# Patient Record
Sex: Male | Born: 1960 | Race: White | Hispanic: No | Marital: Single | Smoking: Never smoker
Health system: Southern US, Community
[De-identification: ages and names within clinical notes are randomized; demographics above are authoritative.]

## PROBLEM LIST (undated history)

## (undated) DIAGNOSIS — N400 Enlarged prostate without lower urinary tract symptoms: Secondary | ICD-10-CM

## (undated) DIAGNOSIS — F419 Anxiety disorder, unspecified: Secondary | ICD-10-CM

## (undated) DIAGNOSIS — R51 Headache: Secondary | ICD-10-CM

## (undated) DIAGNOSIS — C801 Malignant (primary) neoplasm, unspecified: Secondary | ICD-10-CM

## (undated) DIAGNOSIS — R519 Headache, unspecified: Secondary | ICD-10-CM

## (undated) DIAGNOSIS — R972 Elevated prostate specific antigen [PSA]: Secondary | ICD-10-CM

## (undated) DIAGNOSIS — K219 Gastro-esophageal reflux disease without esophagitis: Secondary | ICD-10-CM

## (undated) HISTORY — DX: Benign prostatic hyperplasia without lower urinary tract symptoms: N40.0

## (undated) HISTORY — DX: Elevated prostate specific antigen (PSA): R97.20

## (undated) HISTORY — DX: Gastro-esophageal reflux disease without esophagitis: K21.9

---

## 1988-07-20 HISTORY — PX: MOLE REMOVAL: SHX2046

## 2013-05-08 ENCOUNTER — Ambulatory Visit: Payer: Self-pay | Admitting: Unknown Physician Specialty

## 2014-09-27 IMAGING — CR PARANASAL SINUSES - COMPLETE 3 + VIEW
1 series · 5 of 5 positions shown · non-contrast
Comparison: none

REASON FOR EXAM: headache
COMMENTS:

[Series 1: (person_name) pa · 0.14mm/px · 5 of 5 slices shown]
[im 1/5]
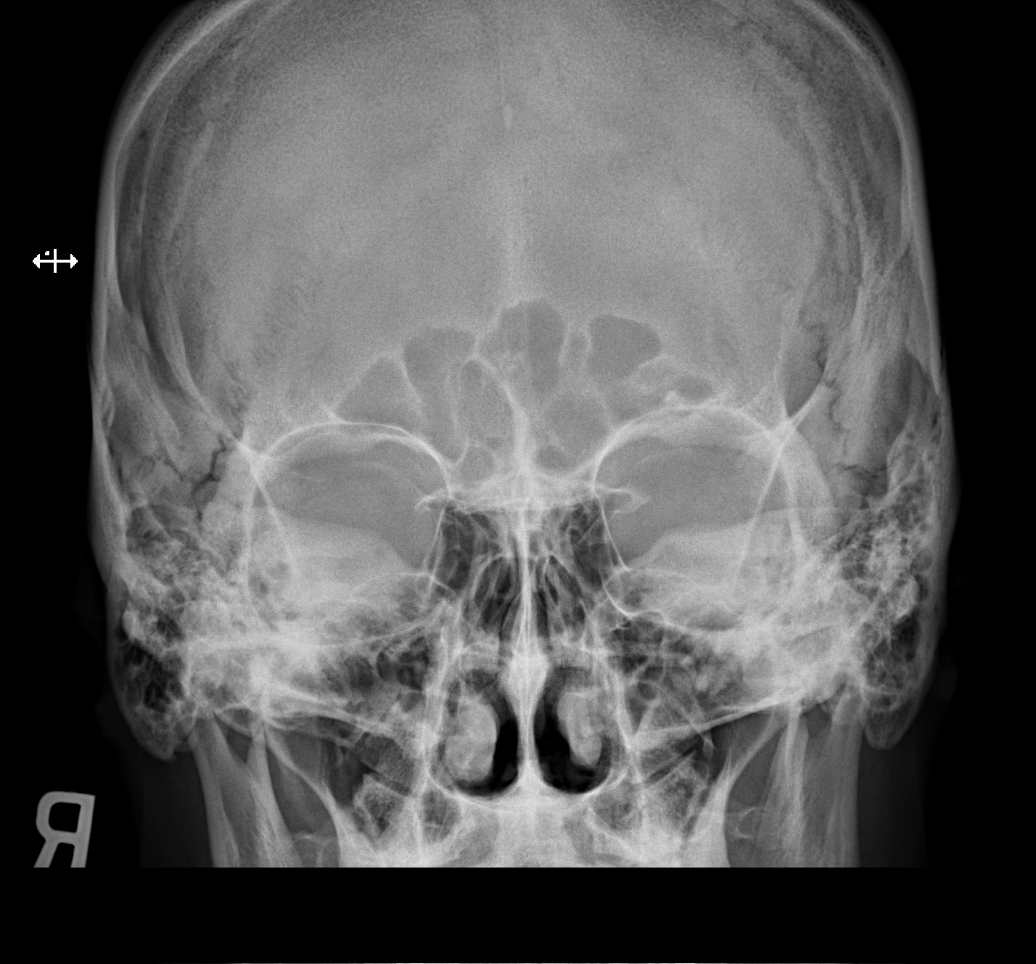
[im 2/5]
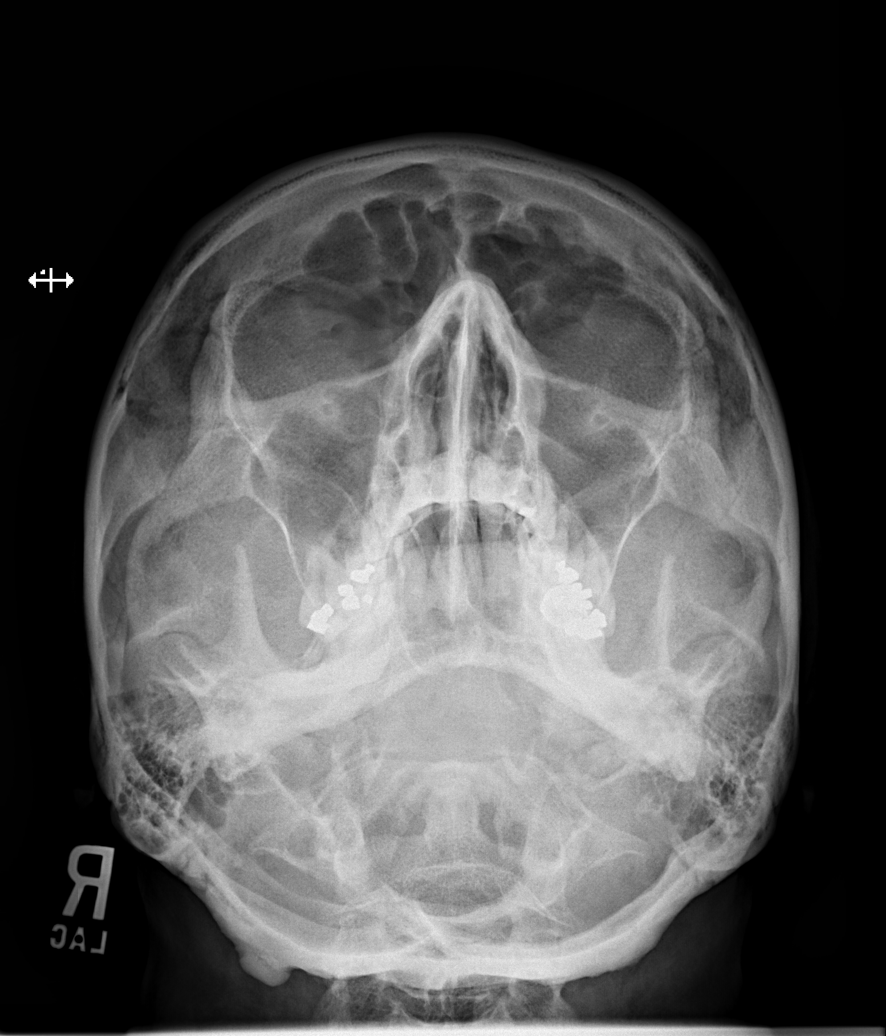
[im 3/5]
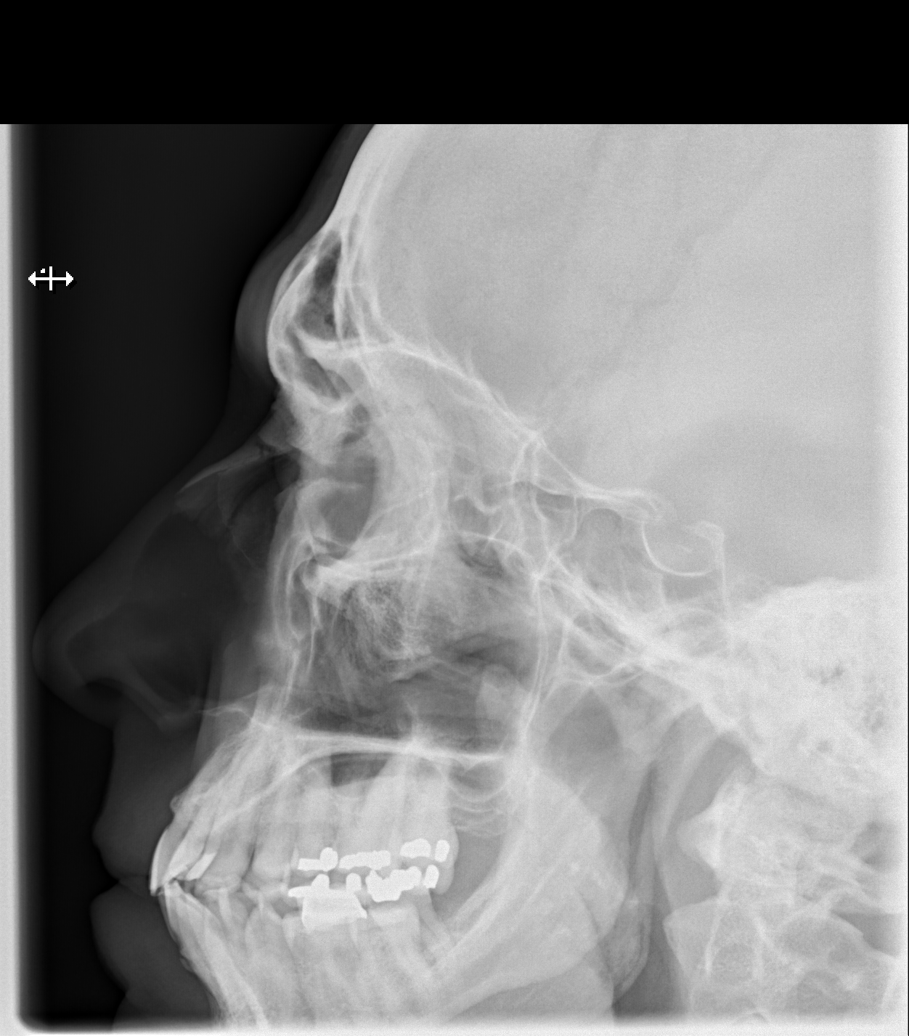
[im 4/5]
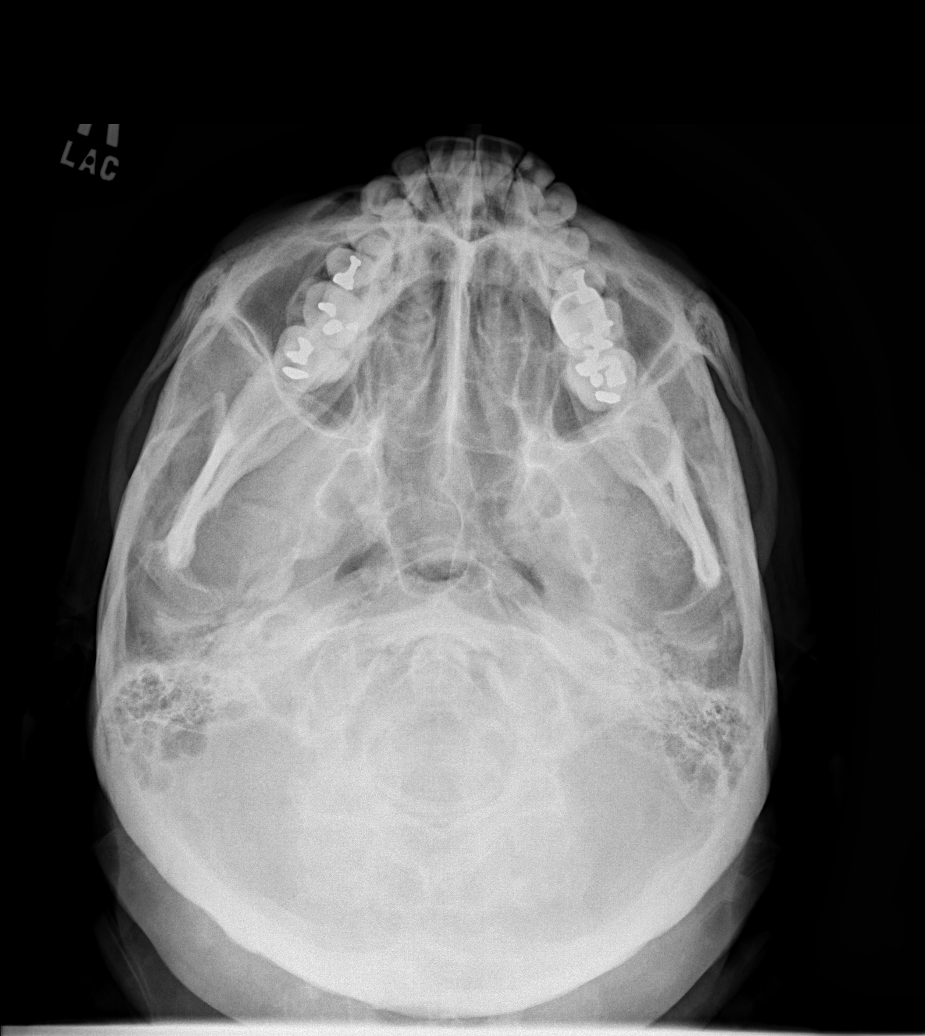
[im 5/5]
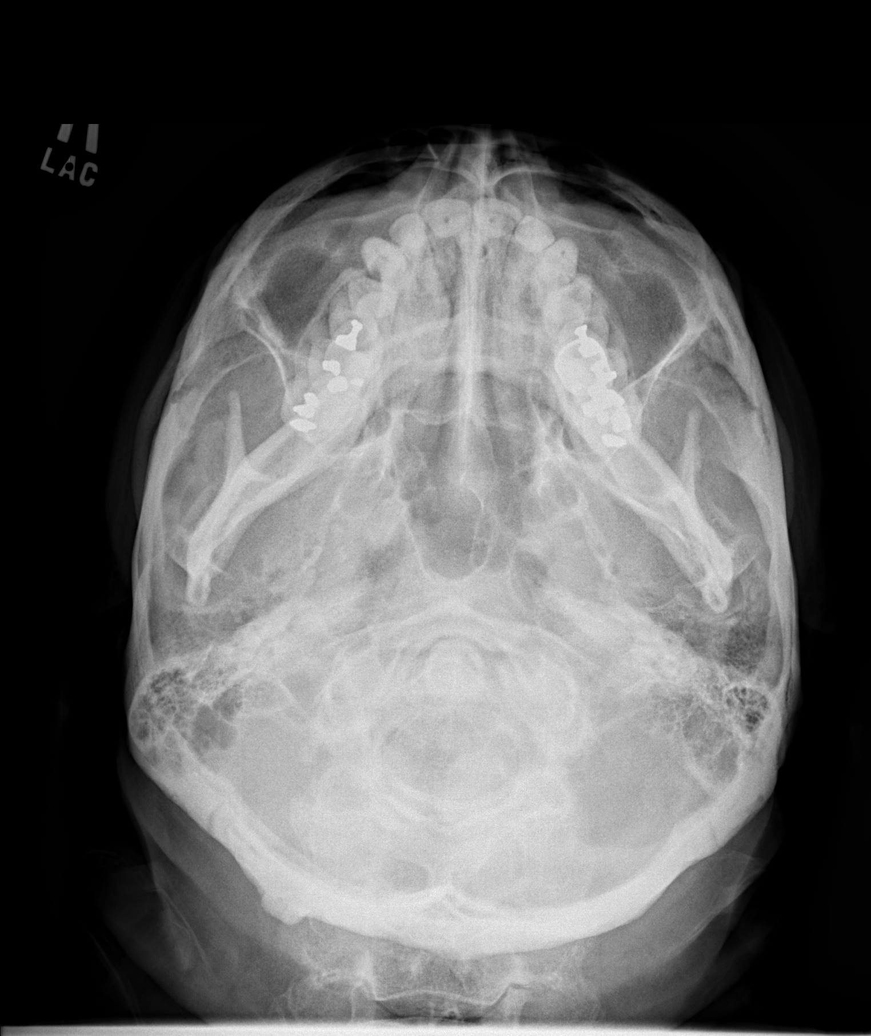

[5 of 5 positions shown; findings below may reference images not displayed]

PROCEDURE:     DXR - DXR SINUSES PARANASAL COMPLETE  - May 08, 2013  [DATE]

RESULT:     Five views of the paranasal sinuses are reviewed. The frontal
sinuses are well pneumatized and clear. The ethmoid and maxillary and
sphenoid sinuses also appear clear. The nasal septum is midline. There is no
evidence of nasal bone fracture. The bony orbits are grossly normal.
IMPRESSION: There is no evidence of acute or chronic sinus
inflammation.

[REDACTED]

## 2015-01-09 ENCOUNTER — Encounter: Payer: Self-pay | Admitting: Urology

## 2015-01-09 ENCOUNTER — Ambulatory Visit (INDEPENDENT_AMBULATORY_CARE_PROVIDER_SITE_OTHER): Payer: BC Managed Care – PPO | Admitting: Urology

## 2015-01-09 VITALS — BP 138/83 | HR 74 | Ht 71.0 in | Wt 196.0 lb

## 2015-01-09 DIAGNOSIS — R972 Elevated prostate specific antigen [PSA]: Secondary | ICD-10-CM

## 2015-01-09 DIAGNOSIS — N401 Enlarged prostate with lower urinary tract symptoms: Secondary | ICD-10-CM | POA: Diagnosis not present

## 2015-01-09 DIAGNOSIS — N138 Other obstructive and reflux uropathy: Secondary | ICD-10-CM

## 2015-01-09 NOTE — Progress Notes (Signed)
01/09/2015 10:18 AM   Isaiah Bush [birth year] [id number]  Referring provider: No referring provider defined for this encounter.  Chief Complaint  Patient presents with  . Elevated PSA    12month w/PSA    HPI: Mr. Brindisi is a 54 year old white male with an elevated PSA who presents today for a recheck on his PSA.  He was referred to [place] in January 2016 by Dr. Elisabeth Pigeon when his PSA returned at 9.4 ng/mL.  We repeated the PSA and it returned at 7.5 ng/mL.  He did some independent research and wanted to try 30 days of an antibiotic and repeat the PSA.  PSA returned at 7.4 ng/mL.    He was against having a biopsy at first, so he compromised on a follow up PSA in 4 months.  During the four months, he decided he wanted a biopsy.  I asked him to repeat the PSA one more time before pursuing a biopsy. He does have an enlarged prostate on exam, but no nodules were palpated.    He has no FM Hx of PCa.   PSA History:    9.4 ng/mL on 07/04/2014    7.5 ng/mL on 08/03/2014    7.4 ng/mL on 09/04/2014  PNH: Past Medical History  Diagnosis Date  . Acid reflux   . BPH (benign prostatic hyperplasia)   . Elevated PSA     Surgical History: Past Surgical History  Procedure Laterality Date  . Mole removal  1990    Home Medications:    Medication List       This list is accurate as of: 01/09/15 10:18 AM.  Always use your most recent med list.               fluticasone 50 MCG/ACT nasal spray  Commonly known as:  FLONASE  Place into both nostrils daily.        Allergies: No Known Allergies  Family History: Family History  Problem Relation Age of Onset  . Hypertension Mother   . Cancer Paternal Grandmother     brain cancer   . Cancer Maternal Grandmother     ovarian cancer     Social History:  reports that he has never smoked. He does not have any smokeless tobacco history on file. He reports that he does not drink alcohol or use illicit drugs.  ROS: Urological  Symptom Review  Patient is experiencing the following symptoms: Hard to postpone urination Get up at night to urinate   Review of Systems  Gastrointestinal (upper)  : Negative for upper GI symptoms  Gastrointestinal (lower) : Negative for lower GI symptoms  Constitutional : Negative for symptoms  Skin: Negative for skin symptoms  Eyes: Negative for eye symptoms  Ear/Nose/Throat : Negative for Ear/Nose/Throat symptoms  Hematologic/Lymphatic: Negative for Hematologic/Lymphatic symptoms  Cardiovascular : Negative for cardiovascular symptoms  Respiratory : Negative for respiratory symptoms  Endocrine: Negative for endocrine symptoms  Musculoskeletal: Negative for musculoskeletal symptoms  Neurological: Negative for neurological symptoms  Psychologic: Negative for psychiatric symptoms   Physical Exam: BP 138/83 mmHg  Pulse 74  Ht 5\' 11"  (1.803 m)  Wt 196 lb (88.905 kg)  BMI 27.35 kg/m2     Laboratory Data: Results for orders placed or performed in visit on 01/09/15  PSA  Result Value Ref Range   Prostate Specific Ag, Serum 7.3 (H) 0.0 - 4.0 ng/mL   No results found for: WBC, HGB, HCT, MCV, PLT  No results found for: CREATININE  No results found for: PSA  No results found for: TESTOSTERONE  No results found for: HGBA1C  Urinalysis No results found for: COLORURINE, APPEARANCEUR, LABSPEC, PHURINE, GLUCOSEU, HGBUR, BILIRUBINUR, KETONESUR, PROTEINUR, UROBILINOGEN, NITRITE, LEUKOCYTESUR  Pertinent Imaging:  Assessment & Plan:    1. Elevated PSA:  I discussed with the patient  the risks involved with a prostate biopsy, such as blood in urine, blood in stool, blood in semen, infection, urinary retention, and on rare occasions sepsis and death.  If his PSA returns elevated, he would like to have a 4K score prior to scheduling a biopsy.   - PSA is drawn today, returned at 7.3 ng/mL  PSA history:    9.4 ng/mL on 07/04/2014    7.5 ng/mL on  07/24/2014    7.4 ng/mL on 09/04/2014    7.3 ng/mL on 01/09/2015  2. BPH (benign prostatic hyperplasia) with LUTS:  I-PSS score is 1 (mild); Quality of life 1 (pleased) on 09/04/2014.     No Follow-up on file.  Zara Council, [facility] [address], [place], [place] [address] [phone]

## 2015-01-10 ENCOUNTER — Telehealth: Payer: Self-pay

## 2015-01-10 DIAGNOSIS — N138 Other obstructive and reflux uropathy: Secondary | ICD-10-CM | POA: Insufficient documentation

## 2015-01-10 DIAGNOSIS — R972 Elevated prostate specific antigen [PSA]: Secondary | ICD-10-CM | POA: Insufficient documentation

## 2015-01-10 DIAGNOSIS — N401 Enlarged prostate with lower urinary tract symptoms: Secondary | ICD-10-CM

## 2015-01-10 LAB — PSA: PROSTATE SPECIFIC AG, SERUM: 7.3 ng/mL — AB (ref 0.0–4.0)

## 2015-01-10 NOTE — Telephone Encounter (Signed)
LMOM

## 2015-01-10 NOTE — Telephone Encounter (Signed)
-----   Message from Nori Riis, PA-C sent at 01/10/2015  2:26 PM EDT ----- Patient's PSA did not change.  He would like to have a 4K score, but he wants to make sure his insurance would cover it.  Would you call the rep Annie Main) and ask him?

## 2015-01-11 NOTE — Telephone Encounter (Signed)
LMOM

## 2015-01-11 NOTE — Telephone Encounter (Signed)
-----   Message from Nori Riis, PA-C sent at 01/10/2015  2:26 PM EDT ----- Patient's PSA did not change.  He would like to have a 4K score, but he wants to make sure his insurance would cover it.  Would you call the rep Annie Main) and ask him?

## 2015-01-14 NOTE — Telephone Encounter (Signed)
LMOM- Spoke with Isaiah Bush with 4K Score.

## 2015-01-14 NOTE — Telephone Encounter (Signed)
Spoke with pt who is still interested in [place]. Made pt aware will call 4K rep and get back with him. Pt voiced understanding. Cw,lpn

## 2015-01-14 NOTE — Telephone Encounter (Signed)
-----   Message from Nori Riis, PA-C sent at 01/10/2015  2:26 PM EDT ----- Patient's PSA did not change.  He would like to have a 4K score, but he wants to make sure his insurance would cover it.  Would you call the rep Annie Main) and ask him?

## 2015-01-15 NOTE — Telephone Encounter (Signed)
LMOM- spoke with 4K Score rep in reference to insurance.

## 2015-07-21 HISTORY — PX: COLONOSCOPY: SHX174

## 2016-03-20 HISTORY — PX: PROSTATE BIOPSY: SHX241

## 2016-05-27 ENCOUNTER — Other Ambulatory Visit: Payer: Self-pay | Admitting: Urology

## 2016-07-30 ENCOUNTER — Other Ambulatory Visit (HOSPITAL_COMMUNITY): Payer: Self-pay | Admitting: *Deleted

## 2016-07-30 NOTE — Patient Instructions (Addendum)
Isaiah Bush  07/30/2016   Your procedure is scheduled on: 08-03-16  Report to [facility]  Entrance take [facility]  elevators to 3rd floor to  [place] at 915 AM.  Call this number if you have problems the morning of surgery [phone]   Remember: ONLY 1 PERSON MAY GO WITH YOU TO SHORT STAY TO GET  READY MORNING OF [place].  Do not eat food :After Midnight, Saturday NIGHT, CLEAR LIQUIDS ALL DAY Sunday [phone] PER DR BORDEN, FOLLOW ALL DR Alinda Money BOWEL PREP  INSTRUCTIONS NOTHING BY MOUTH AFTER MIDNIGHT Sunday NIGHT.      Take these medicines the morning of surgery with A SIP OF WATER: NONE              You may not have any metal on your body including hair pins and              piercings  Do not wear jewelry, make-up, lotions, powders or perfumes, deodorant             Do not wear nail polish.  Do not shave  48 hours prior to surgery.              Men may shave face and neck.   Do not bring valuables to the hospital. [place].  Contacts, dentures or bridgework may not be worn into surgery.  Leave suitcase in the car. After surgery it may be brought to your room.                  Please read over the following fact sheets you were given: _____________________________________________________________________                CLEAR LIQUID DIET   Foods Allowed                                                                     Foods Excluded  Coffee and tea, regular and decaf                             liquids that you cannot  Plain Jell-O in any flavor                                             see through such as: Fruit ices (not with fruit pulp)                                     milk, soups, orange juice  Iced Popsicles                                    All solid food Carbonated beverages, regular and diet  Cranberry, grape and apple juices Sports  drinks like Gatorade Lightly seasoned clear broth or consume(fat free) Sugar, honey syrup  Sample Menu Breakfast                                Lunch                                     Supper Cranberry juice                    Beef broth                            Chicken broth Jell-O                                     Grape juice                           Apple juice Coffee or tea                        Jell-O                                      Popsicle                                                Coffee or tea                        Coffee or tea  _____________________________________________________________________  [facility] - Preparing for Surgery Before surgery, you can play an important role.  Because skin is not sterile, your skin needs to be as free of germs as possible.  You can reduce the number of germs on your skin by washing with CHG (chlorahexidine gluconate) soap before surgery.  CHG is an antiseptic cleaner which kills germs and bonds with the skin to continue killing germs even after washing. Please DO NOT use if you have an allergy to CHG or antibacterial soaps.  If your skin becomes reddened/irritated stop using the CHG and inform your nurse when you arrive at Short Stay. Do not shave (including legs and underarms) for at least 48 hours prior to the first CHG shower.  You may shave your face/neck. Please follow these instructions carefully:  1.  Shower with CHG Soap the night before surgery and the  morning of Surgery.  2.  If you choose to wash your hair, wash your hair first as usual with your  normal  shampoo.  3.  After you shampoo, rinse your hair and body thoroughly to remove the  shampoo.                           4.  Use CHG as you would any other liquid soap.  You can apply chg directly  to the skin and wash  Gently with a scrungie or clean washcloth.  5.  Apply the CHG Soap to your body ONLY FROM THE NECK DOWN.   Do not use on face/  open                           Wound or open sores. Avoid contact with eyes, ears mouth and genitals (private parts).                       Wash face,  Genitals (private parts) with your normal soap.             6.  Wash thoroughly, paying special attention to the area where your surgery  will be performed.  7.  Thoroughly rinse your body with warm water from the neck down.  8.  DO NOT shower/wash with your normal soap after using and rinsing off  the CHG Soap.                9.  Pat yourself dry with a clean towel.            10.  Wear clean pajamas.            11.  Place clean sheets on your bed the night of your first shower and do not  sleep with pets. Day of Surgery : Do not apply any lotions/deodorants the morning of surgery.  Please wear clean clothes to the hospital/surgery center.  FAILURE TO FOLLOW THESE INSTRUCTIONS MAY RESULT IN THE CANCELLATION OF YOUR SURGERY PATIENT SIGNATURE_________________________________  NURSE SIGNATURE__________________________________  ________________________________________________________________________   Isaiah Bush  An incentive spirometer is a tool that can help keep your lungs clear and active. This tool measures how well you are filling your lungs with each breath. Taking long deep breaths may help reverse or decrease the chance of developing breathing (pulmonary) problems (especially infection) following:  A long period of time when you are unable to move or be active. BEFORE THE PROCEDURE   If the spirometer includes an indicator to show your best effort, your nurse or respiratory therapist will set it to a desired goal.  If possible, sit up straight or lean slightly forward. Try not to slouch.  Hold the incentive spirometer in an upright position. INSTRUCTIONS FOR USE  1. Sit on the edge of your bed if possible, or sit up as far as you can in bed or on a chair. 2. Hold the incentive spirometer in an upright  position. 3. Breathe out normally. 4. Place the mouthpiece in your mouth and seal your lips tightly around it. 5. Breathe in slowly and as deeply as possible, raising the piston or the ball toward the top of the column. 6. Hold your breath for 3-5 seconds or for as long as possible. Allow the piston or ball to fall to the bottom of the column. 7. Remove the mouthpiece from your mouth and breathe out normally. 8. Rest for a few seconds and repeat Steps 1 through 7 at least 10 times every 1-2 hours when you are awake. Take your time and take a few normal breaths between deep breaths. 9. The spirometer may include an indicator to show your best effort. Use the indicator as a goal to work toward during each repetition. 10. After each set of 10 deep breaths, practice coughing to be sure your lungs are clear. If you have an incision (the cut made at the time of  surgery), support your incision when coughing by placing a pillow or rolled up towels firmly against it. Once you are able to get out of bed, walk around indoors and cough well. You may stop using the incentive spirometer when instructed by your caregiver.  RISKS AND COMPLICATIONS  Take your time so you do not get dizzy or light-headed.  If you are in pain, you may need to take or ask for pain medication before doing incentive spirometry. It is harder to take a deep breath if you are having pain. AFTER USE  Rest and breathe slowly and easily.  It can be helpful to keep track of a log of your progress. Your caregiver can provide you with a simple table to help with this. If you are using the spirometer at home, follow these instructions: [place] IF:   You are having difficultly using the spirometer.  You have trouble using the spirometer as often as instructed.  Your pain medication is not giving enough relief while using the spirometer.  You develop fever of 100.5 F (38.1 C) or higher. SEEK IMMEDIATE MEDICAL CARE IF:    You cough up bloody sputum that had not been present before.  You develop fever of 102 F (38.9 C) or greater.  You develop worsening pain at or near the incision site. MAKE SURE YOU:   Understand these instructions.  Will watch your condition.  Will get help right away if you are not doing well or get worse. Document Released: 11/16/2006 Document Revised: 09/28/2011 Document Reviewed: 01/17/2007 Exit[employer] Patient Information 2014 Exit[employer], [place].   ________________________________________________________________________  WHAT IS A BLOOD TRANSFUSION? Blood Transfusion Information  A transfusion is the replacement of blood or some of its parts. Blood is made up of multiple cells which provide different functions.  Red blood cells carry oxygen and are used for blood loss replacement.  White blood cells fight against infection.  Platelets control bleeding.  Plasma helps clot blood.  Other blood products are available for specialized needs, such as hemophilia or other clotting disorders. BEFORE THE TRANSFUSION  Who gives blood for transfusions?   Healthy volunteers who are fully evaluated to make sure their blood is safe. This is blood bank blood. Transfusion therapy is the safest it has ever been in the practice of medicine. Before blood is taken from a donor, a complete history is taken to make sure that person has no history of diseases nor engages in risky social behavior (examples are intravenous drug use or sexual activity with multiple partners). The donor's travel history is screened to minimize risk of transmitting infections, such as malaria. The donated blood is tested for signs of infectious diseases, such as HIV and hepatitis. The blood is then tested to be sure it is compatible with you in order to minimize the chance of a transfusion reaction. If you or a relative donates blood, this is often done in anticipation of surgery and is not appropriate for emergency  situations. It takes many days to process the donated blood. RISKS AND COMPLICATIONS Although transfusion therapy is very safe and saves many lives, the main dangers of transfusion include:   Getting an infectious disease.  Developing a transfusion reaction. This is an allergic reaction to something in the blood you were given. Every precaution is taken to prevent this. The decision to have a blood transfusion has been considered carefully by your caregiver before blood is given. Blood is not given unless the benefits outweigh the risks. AFTER THE  TRANSFUSION  Right after receiving a blood transfusion, you will usually feel much better and more energetic. This is especially true if your red blood cells have gotten low (anemic). The transfusion raises the level of the red blood cells which carry oxygen, and this usually causes an energy increase.  The nurse administering the transfusion will monitor you carefully for complications. HOME CARE INSTRUCTIONS  No special instructions are needed after a transfusion. You may find your energy is better. Speak with your caregiver about any limitations on activity for underlying diseases you may have. SEEK MEDICAL CARE IF:   Your condition is not improving after your transfusion.  You develop redness or irritation at the intravenous (IV) site. SEEK IMMEDIATE MEDICAL CARE IF:  Any of the following symptoms occur over the next 12 hours:  Shaking chills.  You have a temperature by mouth above 102 F (38.9 C), not controlled by medicine.  Chest, back, or muscle pain.  People around you feel you are not acting correctly or are confused.  Shortness of breath or difficulty breathing.  Dizziness and fainting.  You get a rash or develop hives.  You have a decrease in urine output.  Your urine turns a dark color or changes to pink, red, or brown. Any of the following symptoms occur over the next 10 days:  You have a temperature by mouth above  102 F (38.9 C), not controlled by medicine.  Shortness of breath.  Weakness after normal activity.  The white part of the eye turns yellow (jaundice).  You have a decrease in the amount of urine or are urinating less often.  Your urine turns a dark color or changes to pink, red, or brown. Document Released: 07/03/2000 Document Revised: 09/28/2011 Document Reviewed: 02/20/2008 [facility] Patient Information 2014 [place], [place].  _______________________________________________________________________

## 2016-07-31 ENCOUNTER — Encounter (HOSPITAL_COMMUNITY)
Admission: RE | Admit: 2016-07-31 | Discharge: 2016-07-31 | Disposition: A | Discharge status: Home / Self Care | Payer: BC Managed Care – PPO | Source: Ambulatory Visit | Attending: Urology | Admitting: Urology

## 2016-07-31 ENCOUNTER — Ambulatory Visit (HOSPITAL_COMMUNITY)
Admission: RE | Admit: 2016-07-31 | Discharge: 2016-07-31 | Disposition: A | Discharge status: Home / Self Care | Payer: BC Managed Care – PPO | Source: Ambulatory Visit | Attending: Urology | Admitting: Urology

## 2016-07-31 ENCOUNTER — Encounter (HOSPITAL_COMMUNITY): Payer: Self-pay

## 2016-07-31 DIAGNOSIS — Z01810 Encounter for preprocedural cardiovascular examination: Secondary | ICD-10-CM | POA: Insufficient documentation

## 2016-07-31 DIAGNOSIS — C61 Malignant neoplasm of prostate: Secondary | ICD-10-CM | POA: Diagnosis not present

## 2016-07-31 DIAGNOSIS — Z01812 Encounter for preprocedural laboratory examination: Secondary | ICD-10-CM | POA: Insufficient documentation

## 2016-07-31 DIAGNOSIS — Z01818 Encounter for other preprocedural examination: Secondary | ICD-10-CM | POA: Insufficient documentation

## 2016-07-31 HISTORY — DX: Malignant (primary) neoplasm, unspecified: C80.1

## 2016-07-31 HISTORY — DX: Headache, unspecified: R51.9

## 2016-07-31 HISTORY — DX: Anxiety disorder, unspecified: F41.9

## 2016-07-31 HISTORY — DX: Headache: R51

## 2016-07-31 LAB — BASIC METABOLIC PANEL
Anion gap: 7 (ref 5–15)
BUN: 16 mg/dL (ref 6–20)
CHLORIDE: 102 mmol/L (ref 101–111)
CO2: 28 mmol/L (ref 22–32)
Calcium: 9.2 mg/dL (ref 8.9–10.3)
Creatinine, Ser: 0.77 mg/dL (ref 0.61–1.24)
GFR calc Af Amer: >60 mL/min (ref >60)
GFR calc non Af Amer: >60 mL/min (ref >60)
GLUCOSE: 96 mg/dL (ref 65–99)
POTASSIUM: 3.8 mmol/L (ref 3.5–5.1)
Sodium: 137 mmol/L (ref 135–145)

## 2016-07-31 LAB — CBC
HEMATOCRIT: 37.9 % — AB (ref 39.0–52.0)
Hemoglobin: 13.5 g/dL (ref 13.0–17.0)
MCH: 30.7 pg (ref 26.0–34.0)
MCHC: 35.6 g/dL (ref 30.0–36.0)
MCV: 86.1 fL (ref 78.0–100.0)
Platelets: 303 10*3/uL (ref 150–400)
RBC: 4.4 MIL/uL (ref 4.22–5.81)
RDW: 12.7 % (ref 11.5–15.5)
WBC: 5.2 10*3/uL (ref 4.0–10.5)

## 2016-07-31 LAB — ABO/RH: ABO/RH(D): B POS

## 2016-07-31 NOTE — H&P (Signed)
Office Visit Report     07/24/2016   --------------------------------------------------------------------------------   Isaiah Bush. Petitti  MRN: [id number]  PRIMARY CARE:  Dion Body, MD  DOB: 1960-08-16, 56 year old Male  REFERRING:  Dion Body, MD  SSN:   PROVIDER:  Kathie Rhodes, M.D.    TREATING:  Raynelle Bring, M.D.    LOCATION:  [facility] [phone]   --------------------------------------------------------------------------------   CC/HPI: CC: Prostate Cancer   PCP: Dr. Dion Body   Mr. Fortuno is a 56 year old gentleman who was found to have an elevated PSA of 10.6. This prompted a TRUS biopsy of the prostate on 04/09/16 that confirmed Gleason 3+4=7 adenocarcinoma of the prostate in 1 out of 12 biopsy cores. He returns today for preoperative evaluation prior to proceeding with his planned prostatectomy on 08/03/16.   Family history: None.   Imaging studies: None.   PMH: He has no medical comorbidities.  PSH: No abdominal surgeries.   TNM stage: cT1c Nx Mx  PSA: 10.6  Gleason score: 3+4=7  Biopsy (04/09/16): 1/12 cores positive  Left: L lateral mid (40%, 3+4=7)  Right: Benign  Prostate volume: 42.1 cc   Nomogram  OC disease: 50%  EPE: 48%  SVI: 2%  LNI: 2%  PFS (5 year, 10 year): 85%,75%   Urinary function: IPSS is 15. His primary symptom is nocturia 3-4 times per night. However, this is not very bothersome to him.  Erectile function: SHIM score is 23. He does not require PDE 5 inhibitors.     ALLERGIES: None   MEDICATIONS: None   GU PSH: Prostate Needle Biopsy - 04/09/2016      PSH Notes: mole removed   NON-GU PSH: Surgical Pathology, Gross And Microscopic Examination For Prostate Needle - 04/09/2016    GU PMH: Postural (urinary) incontinence - 06/10/2016 Prostate Cancer (Stable), He has elected to proceed with surgery. He had a list of questions that we addressed thoroughly and to his satisfaction. I'm going to  schedule him for appointment with Dr. Alinda Money. - 05/04/2016 BPH w/o LUTS, He has mild BPH but has no voiding symptoms. - 03/12/2016      PMH Notes: Adenocarcinoma of the prostate: He was found to have an elevated PSA of 10.6 with no abnormality noted on DRE.  TRUS/BX 04/09/16: Prostate measured 42 cc..  Pathology: Gleason 3+4 = 7 in a single core from the right lobe(40%).  Stage: T1c   NON-GU PMH: Muscle weakness (generalized) - Aug 01, 2016, - 06/10/2016 Other lack of coordination - 08-01-2016, - 06/10/2016 Other muscle spasm - 08-01-16    FAMILY HISTORY: Death of family member - Father heart failure - Father Kidney Stones - Sister No Children - Runs in Family   SOCIAL HISTORY: Marital Status: Single Current Smoking Status: Patient has never smoked.  Does not drink anymore.  Does not use drugs. Does not drink caffeine. Patient's occupation Corporate treasurer.    REVIEW OF SYSTEMS:    GU Review Male:   Patient reports get up at night to urinate and leakage of urine. Patient denies frequent urination, hard to postpone urination, burning/ pain with urination, stream starts and stops, trouble starting your streams, and have to strain to urinate .  Gastrointestinal (Lower):   Patient denies diarrhea and constipation.  Gastrointestinal (Upper):   Patient denies nausea and vomiting.  Constitutional:   Patient denies night sweats, fatigue, weight loss, and fever.  Skin:   Patient denies skin rash/ lesion and itching.  Eyes:  Patient denies blurred vision and double vision.  Ears/ Nose/ Throat:   Patient reports sinus problems. Patient denies sore throat.  Hematologic/Lymphatic:   Patient denies swollen glands and easy bruising.  Cardiovascular:   Patient denies leg swelling and chest pains.  Respiratory:   Patient denies cough and shortness of breath.  Endocrine:   Patient denies excessive thirst.  Musculoskeletal:   Patient reports back pain and joint pain.   Neurological:    Patient reports headaches. Patient denies dizziness.  Psychologic:   Patient denies depression and anxiety.   Notes: Reviewed previous review of systems 05/19/2016. No changes.    VITAL SIGNS:      07/24/2016 01:39 PM  Weight 190 lb / 86.18 kg  Height 71 in / 180.34 cm  BP 145/83 mmHg  Pulse 69 /min  BMI 26.5 kg/m   GU PHYSICAL EXAMINATION:    Prostate: Prostate about 30 grams. Left lobe normal consistency, right lobe normal consistency. Symmetrical lobes. No prostate nodule. Left lobe no tenderness, right lobe no tenderness.    MULTI-SYSTEM PHYSICAL EXAMINATION:    Constitutional: Well-nourished. No physical deformities. Normally developed. Good grooming.  Neck: Neck symmetrical, not swollen. Normal tracheal position.  Respiratory: No labored breathing, no use of accessory muscles. Clear bilaterally.  Cardiovascular: Normal temperature, normal extremity pulses, no swelling, no varicosities. Regular rate and rhythm.  Lymphatic: No enlargement of neck, axillae, groin.  Skin: No paleness, no jaundice, no cyanosis. No lesion, no ulcer, no rash.  Neurologic / Psychiatric: Oriented to time, oriented to place, oriented to person. No depression, no anxiety, no agitation.  Gastrointestinal: No mass, no tenderness, no rigidity, non obese abdomen.  Eyes: Normal conjunctivae. Normal eyelids.  Ears, Nose, Mouth, and Throat: Left ear no scars, no lesions, no masses. Right ear no scars, no lesions, no masses. Nose no scars, no lesions, no masses. Normal hearing. Normal lips.  Musculoskeletal: Normal gait and station of head and neck.     PAST DATA REVIEWED:  Source Of History:  Patient  Lab Test Review:   PSA  Records Review:   Pathology Reports, Previous Patient Records  Urine Test Review:   Urinalysis   01/28/16 01/15/16 01/09/15 09/04/14 07/24/14 07/04/14  PSA  Total PSA 10.6 ng/dl 10.31 ng/dl 7.30 ng/dl 7.4 ng/dl 7.5 ng/dl 9.4 ng/dl  Free PSA 0.91 ng/dl       % Free PSA 8.6 %          PROCEDURES:          Urinalysis w/Scope Dipstick Dipstick Cont'd Micro  Color: Yellow Bilirubin: Neg WBC/hpf: 0 - 5/hpf  Appearance: Clear Ketones: Neg RBC/hpf: 0 - 2/hpf  Specific Gravity: 1.020 Blood: Trace Bacteria: Rare (0-9/hpf)  pH: 6.0 Protein: Neg Cystals: NS (Not Seen)  Glucose: Neg Urobilinogen: 0.2 Casts: NS (Not Seen)    Nitrites: Neg Trichomonas: Not Present    Leukocyte Esterase: Neg Mucous: Not Present      Epithelial Cells: 0 - 5/hpf      Yeast: NS (Not Seen)      Sperm: Not Present    ASSESSMENT:      ICD-10 Details  1 GU:   Prostate Cancer - C61    PLAN:           Schedule Return Visit/Planned Activity: Keep Scheduled Appointment          Document Letter(s):  Created for Patient: Clinical Summary         Notes:   1. Prostate cancer: He has elected  to proceed with surgical treatment of his prostate cancer and is scheduled undergo a bilateral nerve sparing robot-assisted laparoscopic radical prostatectomy and bilateral pelvic lymphadenectomy.   We discussed surgical therapy for prostate cancer including the different available surgical approaches. We discussed, in detail, the risks and expectations of surgery with regard to cancer control, urinary control, and erectile function as well as the expected postoperative recovery process. Additional risks of surgery including but not limited to bleeding, infection, hernia formation, nerve damage, lymphocele formation, bowel/rectal injury potentially necessitating colostomy, damage to the urinary tract resulting in urine leakage, urethral stricture, and the cardiopulmonary risks such as myocardial infarction, stroke, death, venothromboembolism, etc. were explained. The risk of open surgical conversion for robotic/laparoscopic prostatectomy was also discussed.   CC: Dr. Elmer Bales      E & M CODE: I spent at least 40 minutes face to face with the patient, more than 50% of that time was spent on counseling  and/or coordinating care.     * Signed by Raynelle Bring, M.D. on 07/24/16 at 7:52 PM (EST*

## 2016-08-03 ENCOUNTER — Observation Stay (HOSPITAL_COMMUNITY)
Admission: RE | Admit: 2016-08-03 | Discharge: 2016-08-04 | Disposition: A | Discharge status: Home / Self Care | Payer: BC Managed Care – PPO | Source: Ambulatory Visit | Attending: Urology | Admitting: Urology

## 2016-08-03 ENCOUNTER — Encounter (HOSPITAL_COMMUNITY): Payer: Self-pay | Admitting: *Deleted

## 2016-08-03 ENCOUNTER — Inpatient Hospital Stay (HOSPITAL_COMMUNITY): Payer: BC Managed Care – PPO | Admitting: Registered Nurse

## 2016-08-03 ENCOUNTER — Encounter (HOSPITAL_COMMUNITY)
Admission: RE | Disposition: A | Discharge status: Home / Self Care | Payer: Self-pay | Source: Ambulatory Visit | Attending: Urology

## 2016-08-03 DIAGNOSIS — K219 Gastro-esophageal reflux disease without esophagitis: Secondary | ICD-10-CM | POA: Insufficient documentation

## 2016-08-03 DIAGNOSIS — C61 Malignant neoplasm of prostate: Secondary | ICD-10-CM | POA: Diagnosis present

## 2016-08-03 HISTORY — PX: ROBOT ASSISTED LAPAROSCOPIC RADICAL PROSTATECTOMY: SHX5141

## 2016-08-03 HISTORY — PX: LYMPHADENECTOMY: SHX5960

## 2016-08-03 LAB — TYPE AND SCREEN
ABO/RH(D): B POS
Antibody Screen: NEGATIVE

## 2016-08-03 LAB — HEMOGLOBIN AND HEMATOCRIT, BLOOD
HEMATOCRIT: 38 % — AB (ref 39.0–52.0)
HEMOGLOBIN: 13.6 g/dL (ref 13.0–17.0)

## 2016-08-03 LAB — BASIC METABOLIC PANEL
Anion gap: 10 (ref 5–15)
BUN: 9 mg/dL (ref 6–20)
CALCIUM: 7.9 mg/dL — AB (ref 8.9–10.3)
CO2: 24 mmol/L (ref 22–32)
Chloride: 102 mmol/L (ref 101–111)
Creatinine, Ser: 0.94 mg/dL (ref 0.61–1.24)
GFR calc Af Amer: >60 mL/min (ref >60)
Glucose, Bld: 135 mg/dL — ABNORMAL HIGH (ref 65–99)
POTASSIUM: 3 mmol/L — AB (ref 3.5–5.1)
SODIUM: 136 mmol/L (ref 135–145)

## 2016-08-03 SURGERY — XI ROBOTIC ASSISTED LAPAROSCOPIC RADICAL PROSTATECTOMY LEVEL 2
Anesthesia: General | Site: Abdomen

## 2016-08-03 MED ORDER — MEPERIDINE HCL 50 MG/ML IJ SOLN: 6.2500 mg | INTRAMUSCULAR | Status: DC | PRN

## 2016-08-03 MED ORDER — CEFAZOLIN SODIUM-DEXTROSE 2-4 GM/100ML-% IV SOLN
INTRAVENOUS | Status: AC
  Filled 2016-08-03: qty 100

## 2016-08-03 MED ORDER — ROCURONIUM BROMIDE 50 MG/5ML IV SOSY
PREFILLED_SYRINGE | INTRAVENOUS | Status: AC
  Filled 2016-08-03: qty 5

## 2016-08-03 MED ORDER — EPHEDRINE 5 MG/ML INJ
INTRAVENOUS | Status: AC
  Filled 2016-08-03: qty 10

## 2016-08-03 MED ORDER — LIDOCAINE 2% (20 MG/ML) 5 ML SYRINGE
INTRAMUSCULAR | Status: AC
  Filled 2016-08-03: qty 5

## 2016-08-03 MED ORDER — LIDOCAINE HCL (CARDIAC) 20 MG/ML IV SOLN
INTRAVENOUS | Status: DC | PRN
  Administered 2016-08-03: 60 mg via INTRAVENOUS

## 2016-08-03 MED ORDER — SODIUM CHLORIDE 0.9 % IR SOLN
Status: DC | PRN
  Administered 2016-08-03: 1000 mL

## 2016-08-03 MED ORDER — DOCUSATE SODIUM 100 MG PO CAPS
100.0000 mg | ORAL_CAPSULE | Freq: Two times a day (BID) | ORAL | Status: DC
  Administered 2016-08-03 – 2016-08-04 (×2): 100 mg via ORAL
  Filled 2016-08-03 (×2): qty 1

## 2016-08-03 MED ORDER — ONDANSETRON HCL 4 MG/2ML IJ SOLN
INTRAMUSCULAR | Status: DC | PRN
  Administered 2016-08-03: 4 mg via INTRAVENOUS

## 2016-08-03 MED ORDER — LACTATED RINGERS IV SOLN
INTRAVENOUS | Status: DC
  Administered 2016-08-03: 1000 mL via INTRAVENOUS
  Administered 2016-08-03 (×2): via INTRAVENOUS

## 2016-08-03 MED ORDER — PHENYLEPHRINE 40 MCG/ML (10ML) SYRINGE FOR IV PUSH (FOR BLOOD PRESSURE SUPPORT)
PREFILLED_SYRINGE | INTRAVENOUS | Status: AC
  Filled 2016-08-03: qty 10

## 2016-08-03 MED ORDER — MIDAZOLAM HCL 2 MG/2ML IJ SOLN
INTRAMUSCULAR | Status: AC
  Filled 2016-08-03: qty 2

## 2016-08-03 MED ORDER — ACETAMINOPHEN 10 MG/ML IV SOLN
INTRAVENOUS | Status: AC
  Filled 2016-08-03: qty 100

## 2016-08-03 MED ORDER — OXYCODONE HCL 5 MG/5ML PO SOLN
5.0000 mg | Freq: Once | ORAL | Status: DC | PRN
  Filled 2016-08-03: qty 5

## 2016-08-03 MED ORDER — HYDROMORPHONE HCL 2 MG/ML IJ SOLN
INTRAMUSCULAR | Status: AC
  Filled 2016-08-03: qty 1

## 2016-08-03 MED ORDER — PROPOFOL 10 MG/ML IV BOLUS
INTRAVENOUS | Status: DC | PRN
  Administered 2016-08-03: 200 mg via INTRAVENOUS

## 2016-08-03 MED ORDER — DEXAMETHASONE SODIUM PHOSPHATE 10 MG/ML IJ SOLN
INTRAMUSCULAR | Status: AC
  Filled 2016-08-03: qty 1

## 2016-08-03 MED ORDER — OXYCODONE HCL 5 MG PO TABS: 10.0000 mg | ORAL_TABLET | ORAL | Status: DC | PRN

## 2016-08-03 MED ORDER — FENTANYL CITRATE (PF) 250 MCG/5ML IJ SOLN
INTRAMUSCULAR | Status: AC
  Filled 2016-08-03: qty 5

## 2016-08-03 MED ORDER — PROMETHAZINE HCL 25 MG/ML IJ SOLN: 6.2500 mg | INTRAMUSCULAR | Status: DC | PRN

## 2016-08-03 MED ORDER — CEFAZOLIN SODIUM-DEXTROSE 2-4 GM/100ML-% IV SOLN
2.0000 g | INTRAVENOUS | Status: AC
  Administered 2016-08-03: 2 g via INTRAVENOUS
  Filled 2016-08-03: qty 100

## 2016-08-03 MED ORDER — ROCURONIUM BROMIDE 100 MG/10ML IV SOLN
INTRAVENOUS | Status: DC | PRN
  Administered 2016-08-03: 50 mg via INTRAVENOUS
  Administered 2016-08-03 (×2): 20 mg via INTRAVENOUS

## 2016-08-03 MED ORDER — HYDROMORPHONE HCL 1 MG/ML IJ SOLN
0.2500 mg | INTRAMUSCULAR | Status: DC | PRN
  Administered 2016-08-03 (×3): 0.5 mg via INTRAVENOUS

## 2016-08-03 MED ORDER — SODIUM CHLORIDE 0.9 % IV BOLUS (SEPSIS)
1000.0000 mL | Freq: Once | INTRAVENOUS | Status: AC
  Administered 2016-08-03: 1000 mL via INTRAVENOUS

## 2016-08-03 MED ORDER — KETOROLAC TROMETHAMINE 15 MG/ML IJ SOLN
15.0000 mg | Freq: Three times a day (TID) | INTRAMUSCULAR | Status: DC
  Administered 2016-08-03 – 2016-08-04 (×3): 15 mg via INTRAVENOUS
  Filled 2016-08-03 (×3): qty 1

## 2016-08-03 MED ORDER — SUGAMMADEX SODIUM 200 MG/2ML IV SOLN
INTRAVENOUS | Status: AC
  Filled 2016-08-03: qty 2

## 2016-08-03 MED ORDER — MIDAZOLAM HCL 5 MG/5ML IJ SOLN
INTRAMUSCULAR | Status: DC | PRN
  Administered 2016-08-03: 2 mg via INTRAVENOUS

## 2016-08-03 MED ORDER — ACETAMINOPHEN 10 MG/ML IV SOLN
INTRAVENOUS | Status: DC | PRN
  Administered 2016-08-03: 1000 mg via INTRAVENOUS

## 2016-08-03 MED ORDER — ONDANSETRON HCL 4 MG/2ML IJ SOLN
4.0000 mg | INTRAMUSCULAR | Status: DC | PRN
  Administered 2016-08-03: 4 mg via INTRAVENOUS
  Filled 2016-08-03: qty 2

## 2016-08-03 MED ORDER — ONDANSETRON HCL 4 MG/2ML IJ SOLN
INTRAMUSCULAR | Status: AC
  Filled 2016-08-03: qty 2

## 2016-08-03 MED ORDER — LACTATED RINGERS IV SOLN
INTRAVENOUS | Status: DC | PRN
  Administered 2016-08-03: 14:00:00

## 2016-08-03 MED ORDER — OXYCODONE HCL 5 MG PO TABS: 5.0000 mg | ORAL_TABLET | Freq: Once | ORAL | Status: DC | PRN

## 2016-08-03 MED ORDER — HYDROMORPHONE HCL 1 MG/ML IJ SOLN
INTRAMUSCULAR | Status: DC | PRN
  Administered 2016-08-03 (×2): 0.5 mg via INTRAVENOUS
  Administered 2016-08-03: 1 mg via INTRAVENOUS

## 2016-08-03 MED ORDER — PHENYLEPHRINE HCL 10 MG/ML IJ SOLN
INTRAMUSCULAR | Status: DC | PRN
  Administered 2016-08-03 (×3): 80 ug via INTRAVENOUS

## 2016-08-03 MED ORDER — PROPOFOL 10 MG/ML IV BOLUS
INTRAVENOUS | Status: AC
  Filled 2016-08-03: qty 20

## 2016-08-03 MED ORDER — EPHEDRINE SULFATE 50 MG/ML IJ SOLN
INTRAMUSCULAR | Status: DC | PRN
  Administered 2016-08-03: 5 mg via INTRAVENOUS
  Administered 2016-08-03: 10 mg via INTRAVENOUS
  Administered 2016-08-03: 5 mg via INTRAVENOUS

## 2016-08-03 MED ORDER — SENNA 8.6 MG PO TABS
1.0000 | ORAL_TABLET | Freq: Two times a day (BID) | ORAL | Status: DC
  Administered 2016-08-03 – 2016-08-04 (×2): 8.6 mg via ORAL
  Filled 2016-08-03 (×2): qty 1

## 2016-08-03 MED ORDER — SUGAMMADEX SODIUM 200 MG/2ML IV SOLN
INTRAVENOUS | Status: DC | PRN
  Administered 2016-08-03: 175 mg via INTRAVENOUS

## 2016-08-03 MED ORDER — HEPARIN SODIUM (PORCINE) 1000 UNIT/ML IJ SOLN
INTRAMUSCULAR | Status: AC
  Filled 2016-08-03: qty 1

## 2016-08-03 MED ORDER — FENTANYL CITRATE (PF) 100 MCG/2ML IJ SOLN
INTRAMUSCULAR | Status: DC | PRN
  Administered 2016-08-03 (×2): 50 ug via INTRAVENOUS
  Administered 2016-08-03: 100 ug via INTRAVENOUS
  Administered 2016-08-03: 50 ug via INTRAVENOUS

## 2016-08-03 MED ORDER — BUPIVACAINE-EPINEPHRINE 0.25% -1:200000 IJ SOLN
INTRAMUSCULAR | Status: DC | PRN
  Administered 2016-08-03: 30 mL

## 2016-08-03 MED ORDER — KCL IN DEXTROSE-NACL 20-5-0.45 MEQ/L-%-% IV SOLN
INTRAVENOUS | Status: DC
  Administered 2016-08-03 – 2016-08-04 (×2): via INTRAVENOUS
  Filled 2016-08-03 (×3): qty 1000

## 2016-08-03 MED ORDER — MORPHINE SULFATE (PF) 2 MG/ML IV SOLN
2.0000 mg | INTRAVENOUS | Status: DC | PRN
  Administered 2016-08-03: 2 mg via INTRAVENOUS
  Filled 2016-08-03: qty 1

## 2016-08-03 MED ORDER — OXYCODONE-ACETAMINOPHEN 5-325 MG PO TABS: 1.0000 | ORAL_TABLET | ORAL | 0 refills | Status: DC | PRN

## 2016-08-03 MED ORDER — SULFAMETHOXAZOLE-TRIMETHOPRIM 800-160 MG PO TABS
1.0000 | ORAL_TABLET | Freq: Two times a day (BID) | ORAL | 0 refills | Status: AC
Start: 2016-08-03 — End: 2016-08-06

## 2016-08-03 MED ORDER — DEXAMETHASONE SODIUM PHOSPHATE 10 MG/ML IJ SOLN
INTRAMUSCULAR | Status: DC | PRN
  Administered 2016-08-03: 10 mg via INTRAVENOUS

## 2016-08-03 MED ORDER — ACETAMINOPHEN 500 MG PO TABS
1000.0000 mg | ORAL_TABLET | Freq: Four times a day (QID) | ORAL | Status: DC
  Administered 2016-08-03 – 2016-08-04 (×3): 1000 mg via ORAL
  Filled 2016-08-03 (×3): qty 2

## 2016-08-03 MED ORDER — OXYCODONE HCL 5 MG PO TABS: 5.0000 mg | ORAL_TABLET | ORAL | Status: DC | PRN

## 2016-08-03 MED ORDER — BUPIVACAINE HCL (PF) 0.25 % IJ SOLN
INTRAMUSCULAR | Status: AC
  Filled 2016-08-03: qty 30

## 2016-08-03 SURGICAL SUPPLY — 55 items
APPLICATOR COTTON TIP 6IN STRL (MISCELLANEOUS) ×4 IMPLANT
CATH FOLEY 2WAY SLVR 18FR 30CC (CATHETERS) ×4 IMPLANT
CATH ROBINSON RED A/P 16FR (CATHETERS) ×4 IMPLANT
CATH ROBINSON RED A/P 8FR (CATHETERS) ×4 IMPLANT
CATH TIEMANN FOLEY 18FR 5CC (CATHETERS) ×4 IMPLANT
CHLORAPREP W/TINT 26ML (MISCELLANEOUS) ×4 IMPLANT
CLIP LIGATING HEM O LOK PURPLE (MISCELLANEOUS) ×8 IMPLANT
COVER SURGICAL LIGHT HANDLE (MISCELLANEOUS) ×4 IMPLANT
COVER TIP SHEARS 8 DVNC (MISCELLANEOUS) ×2 IMPLANT
COVER TIP SHEARS 8MM DA VINCI (MISCELLANEOUS) ×2
CUTTER ECHEON FLEX ENDO 45 340 (ENDOMECHANICALS) ×4 IMPLANT
DECANTER SPIKE VIAL GLASS SM (MISCELLANEOUS) ×4 IMPLANT
DERMABOND ADVANCED (GAUZE/BANDAGES/DRESSINGS) ×2
DERMABOND ADVANCED .7 DNX12 (GAUZE/BANDAGES/DRESSINGS) ×2 IMPLANT
DRAPE ARM DVNC X/XI (DISPOSABLE) ×8 IMPLANT
DRAPE COLUMN DVNC XI (DISPOSABLE) ×2 IMPLANT
DRAPE DA VINCI XI ARM (DISPOSABLE) ×8
DRAPE DA VINCI XI COLUMN (DISPOSABLE) ×2
DRAPE SURG IRRIG POUCH 19X23 (DRAPES) ×4 IMPLANT
DRSG TEGADERM 4X4.75 (GAUZE/BANDAGES/DRESSINGS) ×4 IMPLANT
ELECT REM PT RETURN 9FT ADLT (ELECTROSURGICAL) ×4
ELECTRODE REM PT RTRN 9FT ADLT (ELECTROSURGICAL) ×2 IMPLANT
GLOVE BIO SURGEON STRL SZ 6.5 (GLOVE) IMPLANT
GLOVE BIO SURGEONS STRL SZ 6.5 (GLOVE)
GLOVE BIOGEL M STRL SZ7.5 (GLOVE) ×8 IMPLANT
GLOVE BIOGEL PI IND STRL 6.5 (GLOVE) ×2 IMPLANT
GLOVE BIOGEL PI INDICATOR 6.5 (GLOVE) ×2
GOWN STRL REUS W/TWL LRG LVL3 (GOWN DISPOSABLE) ×16 IMPLANT
HOLDER FOLEY CATH W/STRAP (MISCELLANEOUS) ×4 IMPLANT
IRRIG SUCT STRYKERFLOW 2 WTIP (MISCELLANEOUS) ×4
IRRIGATION SUCT STRKRFLW 2 WTP (MISCELLANEOUS) ×2 IMPLANT
IV LACTATED RINGERS 1000ML (IV SOLUTION) IMPLANT
NDL SAFETY ECLIPSE 18X1.5 (NEEDLE) ×2 IMPLANT
NEEDLE HYPO 18GX1.5 SHARP (NEEDLE) ×2
PACK ROBOT UROLOGY CUSTOM (CUSTOM PROCEDURE TRAY) ×4 IMPLANT
RELOAD GREEN ECHELON 45 (STAPLE) ×4 IMPLANT
SEAL CANN UNIV 5-8 DVNC XI (MISCELLANEOUS) ×8 IMPLANT
SEAL XI 5MM-8MM UNIVERSAL (MISCELLANEOUS) ×8
SOLUTION ELECTROLUBE (MISCELLANEOUS) ×4 IMPLANT
SUT ETHILON 3 0 PS 1 (SUTURE) ×4 IMPLANT
SUT MNCRL 3 0 RB1 (SUTURE) ×2 IMPLANT
SUT MNCRL 3 0 VIOLET RB1 (SUTURE) ×2 IMPLANT
SUT MNCRL AB 4-0 PS2 18 (SUTURE) ×8 IMPLANT
SUT MONOCRYL 3 0 RB1 (SUTURE) ×4
SUT VIC AB 0 CT1 27 (SUTURE) ×2
SUT VIC AB 0 CT1 27XBRD ANTBC (SUTURE) ×2 IMPLANT
SUT VIC AB 0 UR5 27 (SUTURE) ×4 IMPLANT
SUT VIC AB 2-0 SH 27 (SUTURE) ×2
SUT VIC AB 2-0 SH 27X BRD (SUTURE) ×2 IMPLANT
SUT VICRYL 0 UR6 27IN ABS (SUTURE) ×8 IMPLANT
SYR 27GX1/2 1ML LL SAFETY (SYRINGE) ×4 IMPLANT
TOWEL OR 17X26 10 PK STRL BLUE (TOWEL DISPOSABLE) ×4 IMPLANT
TOWEL OR NON WOVEN STRL DISP B (DISPOSABLE) ×4 IMPLANT
TUBING INSUFFLATION 10FT LAP (TUBING) IMPLANT
WATER STERILE IRR 1500ML POUR (IV SOLUTION) IMPLANT

## 2016-08-03 NOTE — Interval H&P Note (Signed)
History and Physical Interval Note:  08/03/2016 11:43 AM  Isaiah Bush  has presented today for surgery, with the diagnosis of PROSTATE CANCER  The various methods of treatment have been discussed with the patient and family. After consideration of risks, benefits and other options for treatment, the patient has consented to  Procedure(s): XI ROBOTIC ASSISTED LAPAROSCOPIC RADICAL PROSTATECTOMY LEVEL 2 (N/A) PELVIC LYMPHADENECTOMY (Bilateral) as a surgical intervention .  The patient's history has been reviewed, patient examined, no change in status, stable for surgery.  I have reviewed the patient's chart and labs.  Questions were answered to the patient's satisfaction.     Laurelle Skiver,LES

## 2016-08-03 NOTE — Anesthesia Preprocedure Evaluation (Signed)
Anesthesia Evaluation  Patient identified by MRN, date of birth, ID band Patient awake    Reviewed: Allergy & Precautions, NPO status , Patient's Chart, lab work & pertinent test results  Airway Mallampati: II  TM Distance: >3 FB Neck ROM: Full    Dental no notable dental hx.    Pulmonary neg pulmonary ROS,    Pulmonary exam normal breath sounds clear to auscultation       Cardiovascular negative cardio ROS Normal cardiovascular exam Rhythm:Regular Rate:Normal     Neuro/Psych negative neurological ROS  negative psych ROS   GI/Hepatic Neg liver ROS, GERD  ,  Endo/Other  negative endocrine ROS  Renal/GU negative Renal ROS     Musculoskeletal negative musculoskeletal ROS (+)   Abdominal   Peds  Hematology negative hematology ROS (+)   Anesthesia Other Findings   Reproductive/Obstetrics                             Anesthesia Physical Anesthesia Plan  ASA: II  Anesthesia Plan: General   Post-op Pain Management:    Induction: Intravenous  Airway Management Planned: Oral ETT  Additional Equipment:   Intra-op Plan:   Post-operative Plan: Extubation in OR  Informed Consent: I have reviewed the patients History and Physical, chart, labs and discussed the procedure including the risks, benefits and alternatives for the proposed anesthesia with the patient or authorized representative who has indicated his/her understanding and acceptance.   Dental advisory given  Plan Discussed with: CRNA  Anesthesia Plan Comments:         Anesthesia Quick Evaluation

## 2016-08-03 NOTE — Op Note (Signed)
Preoperative diagnosis: Clinically localized adenocarcinoma of the prostate (clinical stage T1c Nx Mx)  Postoperative diagnosis: Clinically localized adenocarcinoma of the prostate (clinical stage T1c Nx Mx)  Procedure:  1. Robotic assisted laparoscopic radical prostatectomy (bilateral nerve sparing) 2. Bilateral robotic assisted laparoscopic pelvic lymphadenectomy  Surgeon: Pryor Curia. M.D.  Assistant: Azucena Fallen, NP-C  An assistant was required for this surgical procedure.  The duties of the assistant included but were not limited to suctioning, passing suture, camera manipulation, retraction. This procedure would not be able to be performed without an Environmental consultant.  Resident: Dr. Virginia Crews  Anesthesia: General  Complications: None  EBL: 50 mL  IVF:  2200 mL crystalloid  Specimens: 1. Prostate and seminal vesicles 2. Right pelvic lymph nodes 3. Left pelvic lymph nodes  Disposition of specimens: Pathology  Drains: 1. 20 Fr coude catheter 2. # 10 Blake pelvic drain  Indication: Isaiah Bush is a 56 y.o. year old patient with clinically localized prostate cancer.  After a thorough review of the management options for treatment of prostate cancer, he elected to proceed with surgical therapy and the above procedure(s).  We have discussed the potential benefits and risks of the procedure, side effects of the proposed treatment, the likelihood of the patient achieving the goals of the procedure, and any potential problems that might occur during the procedure or recuperation. Informed consent has been obtained.  Description of procedure:  The patient was taken to the operating room and a general anesthetic was administered. He was given preoperative antibiotics, placed in the dorsal lithotomy position, and prepped and draped in the usual sterile fashion. Next a preoperative timeout was performed. A urethral catheter was placed into the bladder and a site was selected near  the umbilicus for placement of the camera port. This was placed using a standard open Hassan technique which allowed entry into the peritoneal cavity under direct vision and without difficulty. An 8 mm robotic port was placed and a pneumoperitoneum established. The camera was then used to inspect the abdomen and there was no evidence of any intra-abdominal injuries or other abnormalities. The remaining abdominal ports were then placed. 8 mm robotic ports were placed in the right lower quadrant, left lower quadrant, and far left lateral abdominal wall. A 5 mm port was placed in the right upper quadrant and a 12 mm port was placed in the right lateral abdominal wall for laparoscopic assistance. All ports were placed under direct vision without difficulty. The surgical cart was then docked.   Utilizing the cautery scissors, the bladder was reflected posteriorly allowing entry into the space of Retzius and identification of the endopelvic fascia and prostate. The periprostatic fat was then removed from the prostate allowing full exposure of the endopelvic fascia. The endopelvic fascia was then incised from the apex back to the base of the prostate bilaterally and the underlying levator muscle fibers were swept laterally off the prostate thereby isolating the dorsal venous complex. There was a left sided obturator accessory artery that was preserved. The dorsal vein was then stapled and divided with a 45 mm Flex Echelon stapler. Attention then turned to the bladder neck which was divided anteriorly thereby allowing entry into the bladder and exposure of the urethral catheter. The catheter balloon was deflated and the catheter was brought into the operative field and used to retract the prostate anteriorly. The posterior bladder neck was then examined and was divided allowing further dissection between the bladder and prostate posteriorly until the vasa  deferentia and seminal vessels were identified. The vasa deferentia  were isolated, divided, and lifted anteriorly. The seminal vesicles were dissected down to their tips with care to control the seminal vascular arterial blood supply. These structures were then lifted anteriorly and the space between Denonvillier's fascia and the anterior rectum was developed with a combination of sharp and blunt dissection. This isolated the vascular pedicles of the prostate.  The lateral prostatic fascia was then sharply incised allowing release of the neurovascular bundles bilaterally. The vascular pedicles of the prostate were then ligated with Weck clips between the prostate and neurovascular bundles and divided with sharp cold scissor dissection resulting in neurovascular bundle preservation. The neurovascular bundles were then separated off the apex of the prostate and urethra bilaterally.  The urethra was then sharply transected allowing the prostate specimen to be disarticulated. The pelvis was copiously irrigated and hemostasis was ensured. There was no evidence for rectal injury.  Attention then turned to the right pelvic sidewall. The fibrofatty tissue between the external iliac vein, confluence of the iliac vessels, hypogastric artery, and Cooper's ligament was dissected free from the pelvic sidewall with care to preserve the obturator nerve. Weck clips were used for lymphostasis and hemostasis. An identical procedure was performed on the contralateral side and the lymphatic packets were removed for permanent pathologic analysis.  Attention then turned to the urethral anastomosis. A 2-0 Vicryl slip knot was placed between Denonvillier's fascia, the posterior bladder neck, and the posterior urethra to reapproximate these structures. A double-armed 3-0 Monocryl suture was then used to perform a 360 running tension-free anastomosis between the bladder neck and urethra. A new urethral catheter was then placed into the bladder and irrigated. There were no blood clots within the  bladder and the anastomosis appeared to be watertight. A #19 Blake drain was then brought through the left lateral 8 mm port site and positioned appropriately within the pelvis. It was secured to the skin with a nylon suture. The surgical cart was then undocked. The right lateral 12 mm port site was closed at the fascial level with a 0 Vicryl suture placed laparoscopically. All remaining ports were then removed under direct vision. The prostate specimen was removed intact within the Endopouch retrieval bag via the periumbilical camera port site. This fascial opening was closed with two running 0 Vicryl sutures. 0.25% Marcaine was then injected into all port sites and all incisions were reapproximated at the skin level with 4-0 Monocryl subcuticular sutures and Liquiband. The patient appeared to tolerate the procedure well and without complications. The patient was able to be extubated and transferred to the recovery unit in satisfactory condition.   Pryor Curia MD

## 2016-08-03 NOTE — Transfer of Care (Signed)
Immediate Anesthesia Transfer of Care Note  Patient: Isaiah Bush  Procedure(s) Performed: Procedure(s): XI ROBOTIC ASSISTED LAPAROSCOPIC RADICAL PROSTATECTOMY LEVEL 2 (N/A) PELVIC LYMPHADENECTOMY (Bilateral)  Patient Location: PACU  Anesthesia Type:General  Level of Consciousness:  sedated, patient cooperative and responds to stimulation  Airway & Oxygen Therapy:Patient Spontanous Breathing and Patient connected to face mask oxgen  Post-op Assessment:  Report given to PACU RN and Post -op Vital signs reviewed and stable  Post vital signs:  Reviewed and stable  Last Vitals:  Vitals:   08/03/16 0910  BP: 130/83  Pulse: 89  Resp: 16  Temp: [id number] C    Complications: No apparent anesthesia complications

## 2016-08-03 NOTE — Progress Notes (Signed)
Patient ID: Isaiah Bush, male   DOB: January 12, 1961, 56 y.o.   MRN: [id number]  Post-op note  Subjective: The patient is doing well.  No complaints.  Objective: Vital signs in last 24 hours: Temp:  [97.5 F (36.4 C)-98.2 F (36.8 C)] 97.5 F (36.4 C) (01/15 1736) Pulse Rate:  [89-104] 92 (01/15 1736) Resp:  [14-18] 14 (01/15 1736) BP: (121-149)/(77-94) 135/89 (01/15 1736) SpO2:  [100 %] 100 % (01/15 1736) Weight:  [87.4 kg (192 lb 10.9 oz)-87.5 kg (193 lb)] 87.4 kg (192 lb 10.9 oz) (01/15 1744)  Intake/Output from previous day: No intake/output data recorded. Intake/Output this shift: No intake/output data recorded.  Physical Exam:  General: Alert and oriented. Abdomen: Soft, Nondistended. Incisions: Clean and dry. GU: Urine clear.  Lab Results:  Recent Labs  08/03/16 1645  HGB 13.6  HCT 38.0*    Assessment/Plan: POD#0   1) Continue to monitor, ambulate, IS   Isaiah Bush. MD   LOS: 1 day   Isaiah Bush,LES 08/03/2016, 7:10 PM

## 2016-08-03 NOTE — Anesthesia Procedure Notes (Signed)
Procedure Name: Intubation Date/Time: 08/03/2016 12:23 PM Performed by: Glory Buff Pre-anesthesia Checklist: Patient identified, Emergency Drugs available, Suction available and Patient being monitored Patient Re-evaluated:Patient Re-evaluated prior to inductionOxygen Delivery Method: Circle system utilized Preoxygenation: Pre-oxygenation with 100% oxygen Intubation Type: IV induction Ventilation: Mask ventilation without difficulty Laryngoscope Size: Miller and 3 Grade View: Grade III Tube type: Oral Tube size: 7.5 mm Number of attempts: 1 Airway Equipment and Method: Oral airway and Bougie stylet Placement Confirmation: ETT inserted through vocal cords under direct vision,  positive ETCO2 and breath sounds checked- equal and bilateral Secured at: 22 cm Tube secured with: Tape Dental Injury: Teeth and Oropharynx as per pre-operative assessment  Difficulty Due To: Difficulty was unanticipated and Difficult Airway- due to anterior larynx Future Recommendations: Recommend- induction with short-acting agent, and alternative techniques readily available

## 2016-08-04 DIAGNOSIS — C61 Malignant neoplasm of prostate: Secondary | ICD-10-CM | POA: Diagnosis not present

## 2016-08-04 LAB — HEMOGLOBIN AND HEMATOCRIT, BLOOD
HEMATOCRIT: 35.9 % — AB (ref 39.0–52.0)
HEMOGLOBIN: 12.6 g/dL — AB (ref 13.0–17.0)

## 2016-08-04 MED ORDER — BISACODYL 10 MG RE SUPP
10.0000 mg | Freq: Once | RECTAL | Status: AC
  Administered 2016-08-04: 10 mg via RECTAL
  Filled 2016-08-04: qty 1

## 2016-08-04 MED ORDER — HYDROCODONE-ACETAMINOPHEN 5-325 MG PO TABS
1.0000 | ORAL_TABLET | Freq: Four times a day (QID) | ORAL | Status: DC | PRN
  Filled 2016-08-04: qty 1

## 2016-08-04 NOTE — Discharge Instructions (Signed)

## 2016-08-04 NOTE — Progress Notes (Signed)
Patient ID: Isaiah Bush, male   DOB: 1960-11-06, 56 y.o.   MRN: [id number]  1 Day Post-Op Subjective: The patient is doing well.  No nausea or vomiting. Pain is adequately controlled.  Objective: Vital signs in last 24 hours: Temp:  [97.5 F (36.4 C)-99.1 F (37.3 C)] 98 F (36.7 C) (01/16 0649) Pulse Rate:  [72-104] 86 (01/16 0649) Resp:  [14-18] 18 (01/16 0649) BP: (94-149)/(48-94) 110/68 (01/16 0649) SpO2:  [100 %] 100 % (01/16 0649) Weight:  [87.4 kg (192 lb 10.9 oz)-87.5 kg (193 lb)] 87.4 kg (192 lb 10.9 oz) (01/15 1744)  Intake/Output from previous day: 01/15 0701 - 01/16 0700 In: 5138.8 [I.V.:4138.8; IV Piggyback:1000] Out: [id number] [Urine:3900; Drains:200; Blood:50] Intake/Output this shift: No intake/output data recorded.  Physical Exam:  General: Alert and oriented. CV: RRR Lungs: Clear bilaterally. GI: Soft, Nondistended. Incisions: Clean, dry, and intact Urine: Clear Extremities: Nontender, no erythema, no edema.  Lab Results:  Recent Labs  08/03/16 1645 08/04/16 0521  HGB 13.6 12.6*  HCT 38.0* 35.9*      Assessment/Plan: POD# 1 s/p robotic prostatectomy.  1) SL IVF 2) Ambulate, Incentive spirometry 3) Transition to oral pain medication 4) Dulcolax suppository 5) D/C pelvic drain 6) Plan for likely discharge later today   Pryor Curia. MD   LOS: 1 day   Aldair Rickel,LES 08/04/2016, 7:41 AM

## 2016-08-04 NOTE — Anesthesia Postprocedure Evaluation (Signed)
Anesthesia Post Note  Patient: Isaiah Bush  Procedure(s) Performed: Procedure(s) (LRB): XI ROBOTIC ASSISTED LAPAROSCOPIC RADICAL PROSTATECTOMY LEVEL 2 (N/A) PELVIC LYMPHADENECTOMY (Bilateral)  Patient location during evaluation: PACU Anesthesia Type: General Level of consciousness: awake and alert Pain management: pain level controlled Vital Signs Assessment: post-procedure vital signs reviewed and stable Respiratory status: spontaneous breathing, nonlabored ventilation, respiratory function stable and patient connected to nasal cannula oxygen Cardiovascular status: blood pressure returned to baseline and stable Postop Assessment: no signs of nausea or vomiting Anesthetic complications: no        Last Vitals:  Vitals:   08/04/16 0221 08/04/16 0649  BP: (!) 94/48 110/68  Pulse: 72 86  Resp: 18 18  Temp: 37.3 C 36.7 C    Last Pain:  Vitals:   08/04/16 0830  TempSrc:   PainSc: 3    Pain Goal: Patients Stated Pain Goal: 3 (08/04/16 0830)               Effie Berkshire

## 2016-08-04 NOTE — Discharge Summary (Signed)
  Date of admission: 08/03/2016  Date of discharge: 08/04/2016  Admission diagnosis: Prostate Cancer  Discharge diagnosis: Prostate Cancer  History and Physical: For full details, please see admission history and physical. Briefly, Isaiah Bush is a 56 y.o. gentleman with localized prostate cancer.  After discussing management/treatment options, he elected to proceed with surgical treatment.  Hospital Course: Isaiah Bush was taken to the operating room on 08/03/2016 and underwent a robotic assisted laparoscopic radical prostatectomy. He tolerated this procedure well and without complications. Postoperatively, he was able to be transferred to a regular hospital room following recovery from anesthesia.  He was able to begin ambulating the night of surgery. He remained hemodynamically stable overnight.  He had excellent urine output with appropriately minimal output from his pelvic drain and his pelvic drain was removed on POD #1.  He was transitioned to oral pain medication, tolerated a clear liquid diet, and had met all discharge criteria and was able to be discharged home later on POD#1.  Laboratory values:  Recent Labs  08/03/16 1645 08/04/16 0521  HGB 13.6 12.6*  HCT 38.0* 35.9*    Disposition: Home  Discharge instruction: He was instructed to be ambulatory but to refrain from heavy lifting, strenuous activity, or driving. He was instructed on urethral catheter care.  Discharge medications:  Allergies as of 08/04/2016   No Known Allergies     Medication List    TAKE these medications   oxyCODONE-acetaminophen 5-325 MG tablet Commonly known as:  ROXICET Take 1-2 tablets by mouth every 4 (four) hours as needed for severe pain.   sulfamethoxazole-trimethoprim 800-160 MG tablet Commonly known as:  BACTRIM DS,SEPTRA DS Take 1 tablet by mouth 2 (two) times daily. Start day prior to catheter removal.       Followup: He will followup in 1 week for catheter removal and to discuss his  surgical pathology results.

## 2016-08-07 ENCOUNTER — Encounter (HOSPITAL_COMMUNITY): Payer: Self-pay | Admitting: Urology

## 2016-12-18 NOTE — Anesthesia Postprocedure Evaluation (Signed)
Anesthesia Post Note  Patient: Isaiah Bush  Procedure(s) Performed: Procedure(s) (LRB): XI ROBOTIC ASSISTED LAPAROSCOPIC RADICAL PROSTATECTOMY LEVEL 2 (N/A) PELVIC LYMPHADENECTOMY (Bilateral)     Anesthesia Post Evaluation  Last Vitals:  Vitals:   08/04/16 0221 08/04/16 0649  BP: (!) 94/48 110/68  Pulse: 72 86  Resp: 18 18  Temp: 37.3 C 36.7 C    Last Pain:  Vitals:   08/04/16 0830  TempSrc:   PainSc: Faulk Neave Lenger

## 2016-12-18 NOTE — Addendum Note (Signed)
Addendum  created 12/18/16 0906 by Effie Berkshire, MD   Sign clinical note

## 2017-12-20 IMAGING — CR DG CHEST 2V
2 series · 2 of 2 positions shown · non-contrast
Comparison: None.

CLINICAL DATA: Preop examination

EXAM:
CHEST  2 VIEW

[w chest pa]
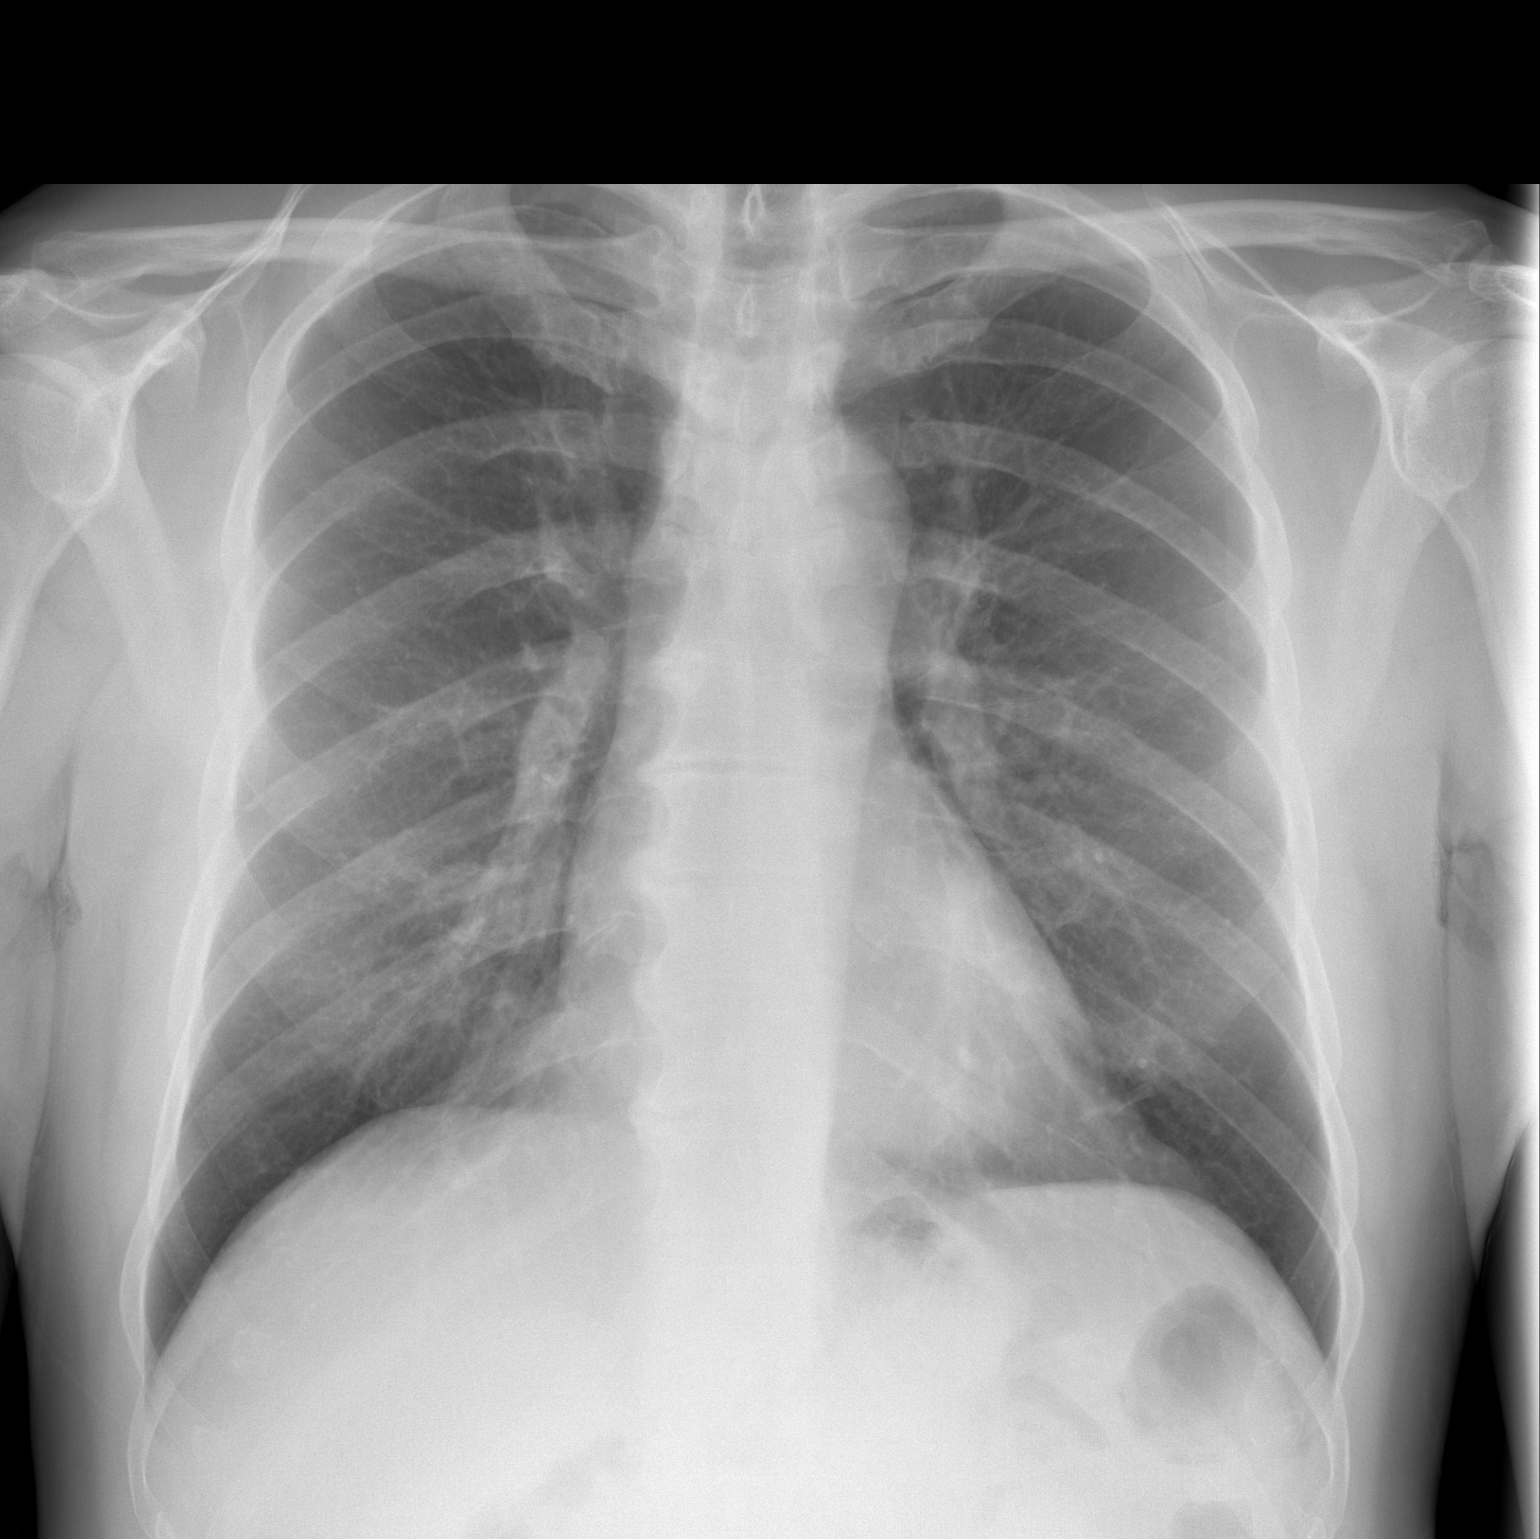

[w chest lat]
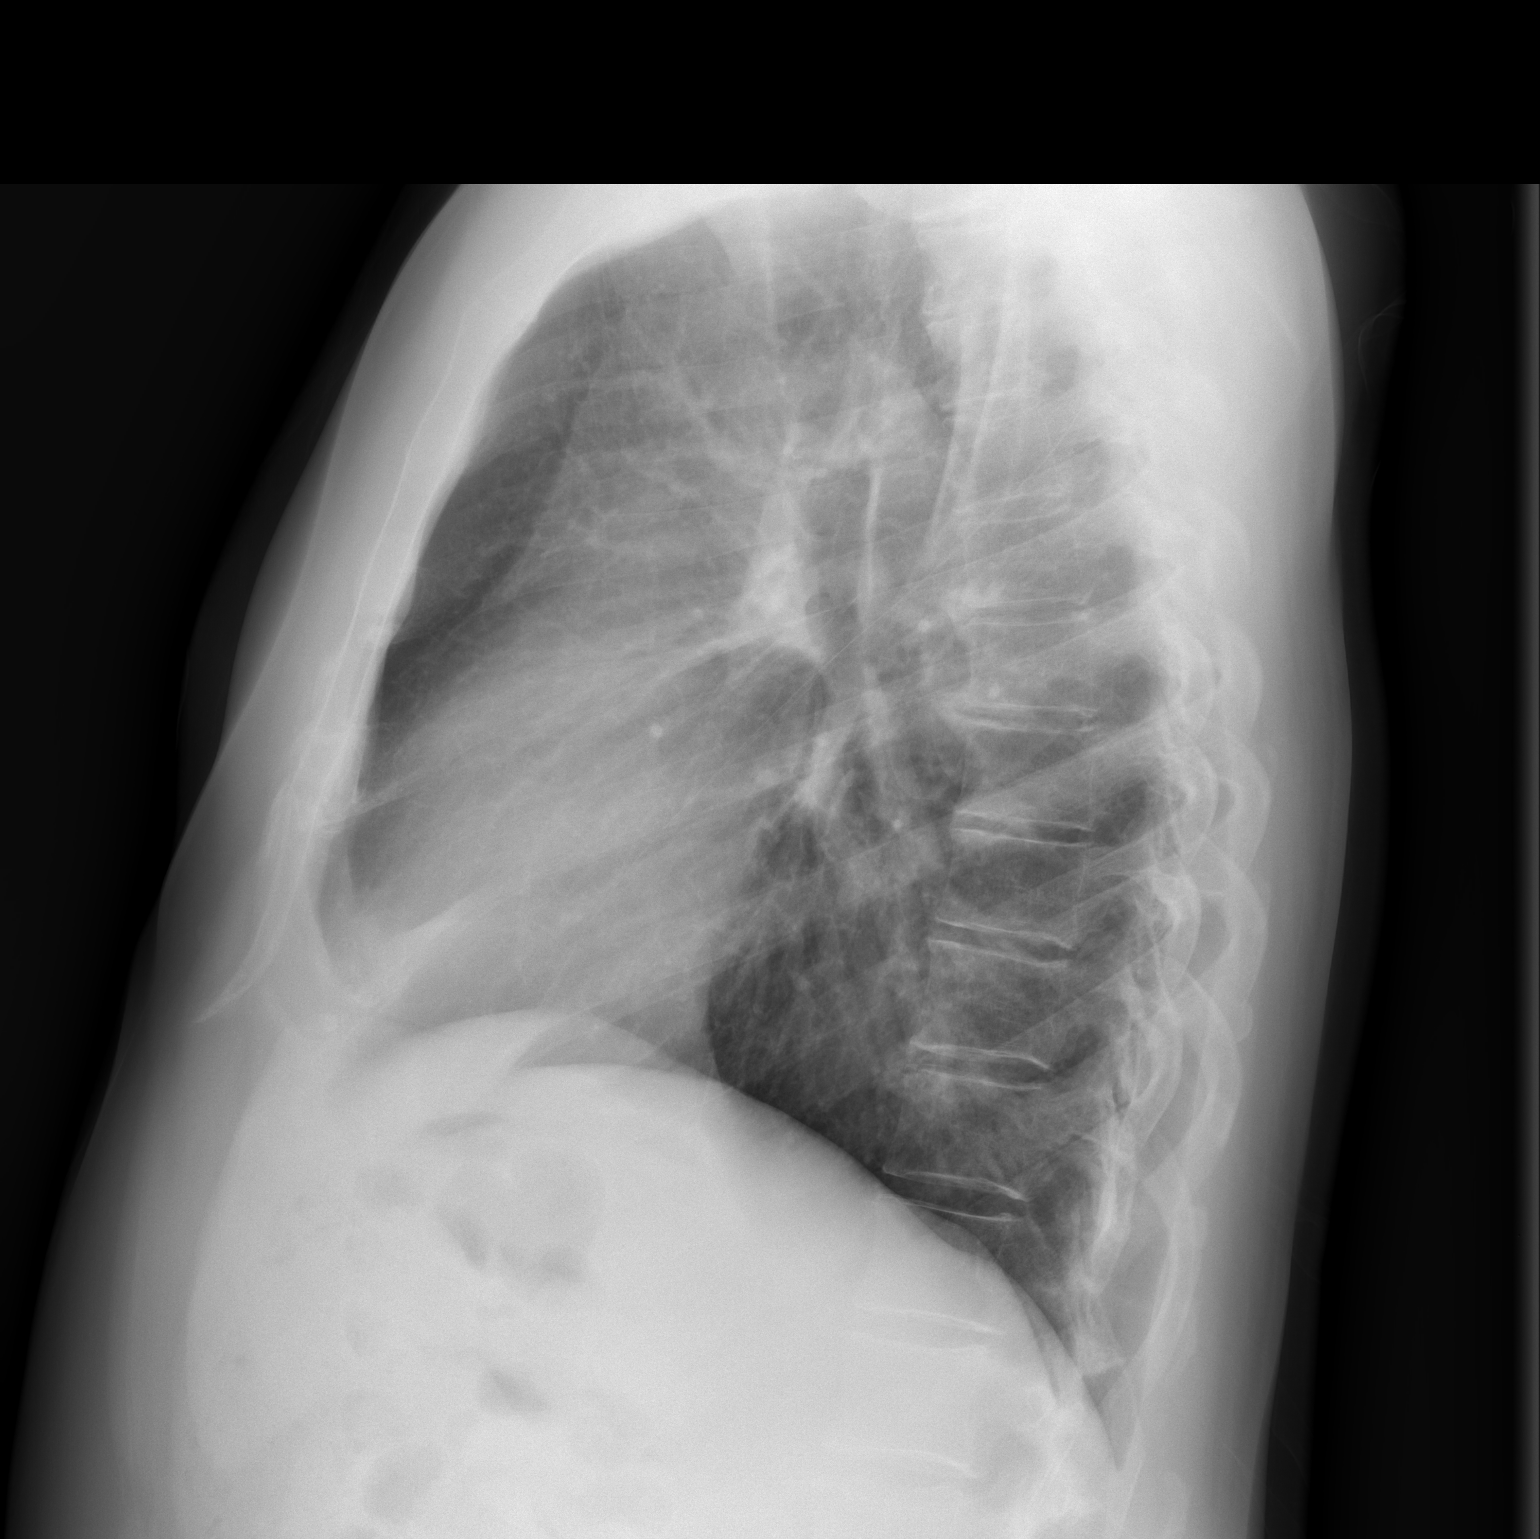

[2 of 2 positions shown; findings below may reference images not displayed]

FINDINGS: Cardiomediastinal silhouette is unremarkable. No infiltrate or
pleural effusion. No pulmonary edema. Mild degenerative changes mid
thoracic spine.
IMPRESSION: No active cardiopulmonary disease.

## 2019-08-18 ENCOUNTER — Ambulatory Visit: Payer: BC Managed Care – PPO | Attending: Internal Medicine

## 2019-08-18 DIAGNOSIS — Z20822 Contact with and (suspected) exposure to covid-19: Secondary | ICD-10-CM

## 2019-08-19 LAB — NOVEL CORONAVIRUS, NAA: SARS-CoV-2, NAA: NOT DETECTED

## 2019-08-21 ENCOUNTER — Telehealth: Payer: Self-pay | Admitting: *Deleted

## 2019-08-21 ENCOUNTER — Telehealth: Payer: Self-pay

## 2019-08-21 NOTE — Telephone Encounter (Signed)
Per pt. Request, faxed pt's COVID results to employer @ [phone]; attn.: Jerrilyn Cairo.

## 2019-08-21 NOTE — Telephone Encounter (Signed)
Patient requesting negative Covid 19 results to be faxed to employer today. [phone]  Attn:Mark Orvan Seen

## 2019-08-21 NOTE — Telephone Encounter (Signed)
Pt. Given COVID 19 results, verbalizes understanding. 

## 2020-12-20 ENCOUNTER — Ambulatory Visit: Payer: Self-pay | Admitting: Surgery

## 2020-12-20 NOTE — H&P (Signed)
Isaiah Bush Appointment: 12/20/2020 9:30 AM Location: [facility] Patient #: [id number] DOB: [birth year] Single / Language: Isaiah Bush / Race: White Male  History of Present Illness Isaiah Hector MD; 12/20/2020 9:35 AM) The patient is a 60 year old male who presents with an abdominal wall hernia. Note for "Abdominal hernia": ` ` ` Patient sent for surgical consultation at the request of Dutch Gray, MD  Chief Complaint: Umbilical hernia ` ` The patient is a prostate cancer survivor status post robotic radical prostatectomy 2018 by Dr. Raynelle Bring. Has been followed by him in the survivable pathway. Umbilical hernia noted on examination more recently. Surgical consultation offered. 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 Moderate activity. He is moderately physically active. He has noticed a bulge around his bellybutton for the past 2 years. He thinks it is gotten larger. He is not having any severe painful episodes. He does get pain or discomfort when he is exercising more turning or twisting. Basic concerns surgical evaluation offered. He does not smoke. No diabetes. Moves his bowels once or twice a day. No sleep apnea. Not immunosuppressed. No other abdominal surgeries site from his radical prostatectomy. He has some very mild urinary incontinence/leakage. Some mild erectile dysfunction which is wished to be handled with medication.  (Review of systems as stated in this history (HPI) or in the review of systems. Otherwise all other 12 point ROS are negative) ` ` ###########################################`  This patient encounter took 25 minutes today to perform the following: obtain history, perform exam, review outside records, interpret tests & imaging, counsel the patient on their diagnosis; and, document this encounter, including findings & plan in the electronic health record (EHR).   Past Surgical History Janeann Forehand, CNA; 12/20/2020 9:02  AM) Prostate Surgery - Removal  Diagnostic Studies History Janeann Forehand, CNA; 12/20/2020 9:02 AM) Colonoscopy 1-5 years ago  Allergies Janeann Forehand, CNA; 12/20/2020 9:02 AM) No Known Drug Allergies [12/20/2020]: Allergies Reconciled  Medication History Janeann Forehand, CNA; 12/20/2020 9:03 AM) No Current Medications Medications Reconciled  Social History Janeann Forehand, CNA; 12/20/2020 9:02 AM) Alcohol use Occasional alcohol use. Caffeine use Carbonated beverages. No drug use Tobacco use Never smoker.  Family History Janeann Forehand, CNA; 12/20/2020 9:02 AM) Alcohol Abuse Father. Heart Disease Mother.  Other Problems Janeann Forehand, CNA; 12/20/2020 9:02 AM) Migraine Headache Prostate Cancer Umbilical Hernia Repair     Review of Systems (Donyelle Alston CNA; 12/20/2020 9:02 AM) General Not Present- Appetite Loss, Chills, Fatigue, Fever, Night Sweats, Weight Gain and Weight Loss. Skin Not Present- Change in Wart/Mole, Dryness, Hives, Jaundice, New Lesions, Non-Healing Wounds, Rash and Ulcer. HEENT Present- Visual Disturbances and Wears glasses/contact lenses. Not Present- Earache, Hearing Loss, Hoarseness, Nose Bleed, Oral Ulcers, Ringing in the Ears, Seasonal Allergies, Sinus Pain, Sore Throat and Yellow Eyes. Respiratory Not Present- Bloody sputum, Chronic Cough, Difficulty Breathing, Snoring and Wheezing. Cardiovascular Not Present- Chest Pain, Difficulty Breathing Lying Down, Leg Cramps, Palpitations, Rapid Heart Rate, Shortness of Breath and Swelling of Extremities. Gastrointestinal Not Present- Abdominal Pain, Bloating, Bloody Stool, Change in Bowel Habits, Chronic diarrhea, Constipation, Difficulty Swallowing, Excessive gas, Gets full quickly at meals, Hemorrhoids, Indigestion, Nausea, Rectal Pain and Vomiting. Male Genitourinary Present- Urine Leakage. Not Present- Blood in Urine, Change in Urinary Stream, Frequency, Impotence, Nocturia, Painful Urination  and Urgency. Musculoskeletal Not Present- Back Pain, Joint Pain, Joint Stiffness, Muscle Pain, Muscle Weakness and Swelling of Extremities. Neurological Not Present- Decreased Memory, Fainting, Headaches, Numbness, Seizures, Tingling, Tremor, Trouble walking and Weakness.  Psychiatric Not Present- Anxiety, Bipolar, Change in Sleep Pattern, Depression, Fearful and Frequent crying. Endocrine Not Present- Cold Intolerance, Excessive Hunger, Hair Changes, Heat Intolerance, Hot flashes and New Diabetes. Hematology Not Present- Blood Thinners, Easy Bruising, Excessive bleeding, Gland problems, HIV and Persistent Infections.  Vitals (Donyelle Alston CNA; 12/20/2020 9:03 AM) 12/20/2020 9:03 AM Weight: 203.13 lb Height: 71in Body Surface Area: 2.12 m Body Mass Index: 28.33 kg/m  Temp.: 98.63F  Pulse: 77 (Regular)  P.OX: 97% (Room air) BP: 136/80(Sitting, Left Arm, Standard)        Physical Exam Isaiah Hector MD; 12/20/2020 9:27 AM)  General Mental Status-Alert. General Appearance-Not in acute distress, Not Sickly. Orientation-Oriented X3. Hydration-Well hydrated. Voice-Normal.  Integumentary Global Assessment Upon inspection and palpation of skin surfaces of the - Axillae: non-tender, no inflammation or ulceration, no drainage. and Distribution of scalp and body hair is normal. General Characteristics Temperature - normal warmth is noted.  Head and Neck Head-normocephalic, atraumatic with no lesions or palpable masses. Face Global Assessment - atraumatic, no absence of expression. Neck Global Assessment - no abnormal movements, no bruit auscultated on the right, no bruit auscultated on the left, no decreased range of motion, non-tender. Trachea-midline. Thyroid Gland Characteristics - non-tender.  Eye Eyeball - Left-Extraocular movements intact, No Nystagmus - Left. Eyeball - Right-Extraocular movements intact, No Nystagmus - Right. Cornea -  Left-No Hazy - Left. Cornea - Right-No Hazy - Right. Sclera/Conjunctiva - Left-No scleral icterus, No Discharge - Left. Sclera/Conjunctiva - Right-No scleral icterus, No Discharge - Right. Pupil - Left-Direct reaction to light normal. Pupil - Right-Direct reaction to light normal.  ENMT Ears Pinna - Left - no drainage observed, no generalized tenderness observed. Pinna - Right - no drainage observed, no generalized tenderness observed. Nose and Sinuses External Inspection of the Nose - no destructive lesion observed. Inspection of the nares - Left - quiet respiration. Inspection of the nares - Right - quiet respiration. Mouth and Throat Lips - Upper Lip - no fissures observed, no pallor noted. Lower Lip - no fissures observed, no pallor noted. Nasopharynx - no discharge present. Oral Cavity/Oropharynx - Tongue - no dryness observed. Oral Mucosa - no cyanosis observed. Hypopharynx - no evidence of airway distress observed.  Chest and Lung Exam Inspection Movements - Normal and Symmetrical. Accessory muscles - No use of accessory muscles in breathing. Palpation Palpation of the chest reveals - Non-tender. Auscultation Breath sounds - Normal and Clear.  Cardiovascular Auscultation Rhythm - Regular. Murmurs & Other Heart Sounds - Auscultation of the heart reveals - No Murmurs and No Systolic Clicks.  Abdomen Inspection Inspection of the abdomen reveals - No Visible peristalsis and No Abnormal pulsations. Umbilicus - No Bleeding, No Urine drainage. Palpation/Percussion Palpation and Percussion of the abdomen reveal - Soft, Non Tender, No Rebound tenderness, No Rigidity (guarding) and No Cutaneous hyperesthesia. Note: Mild supraorbital diastases recti. Supraumbilical bulging 5 x 4 cm region somewhat reducible. It seems between umbilicus and his right paramedian robotic incision. Consistent with hernia.   Abdomen soft. Nontender. Not distended. No guarding.  Male  Genitourinary Sexual Maturity Tanner 5 - Adult hair pattern and Adult penile size and shape.  Peripheral Vascular Upper Extremity Inspection - Left - No Cyanotic nailbeds - Left, Not Ischemic. Inspection - Right - No Cyanotic nailbeds - Right, Not Ischemic.  Neurologic Neurologic evaluation reveals -normal attention span and ability to concentrate, able to name objects and repeat phrases. Appropriate fund of knowledge , normal sensation and normal coordination. Mental Status Affect -  not angry, not paranoid. Cranial Nerves-Normal Bilaterally. Gait-Normal.  Neuropsychiatric Mental status exam performed with findings of-able to articulate well with normal speech/language, rate, volume and coherence, thought content normal with ability to perform basic computations and apply abstract reasoning and no evidence of hallucinations, delusions, obsessions or homicidal/suicidal ideation.  Musculoskeletal Global Assessment Spine, Ribs and Pelvis - no instability, subluxation or laxity. Right Upper Extremity - no instability, subluxation or laxity.  Lymphatic Head & Neck  General Head & Neck Lymphatics: Bilateral - Description - No Localized lymphadenopathy. Axillary  General Axillary Region: Bilateral - Description - No Localized lymphadenopathy. Femoral & Inguinal  Generalized Femoral & Inguinal Lymphatics: Left - Description - No Localized lymphadenopathy. Right - Description - No Localized lymphadenopathy.    Assessment & Plan Isaiah Hector MD; 12/20/2020 9:35 AM)  Mickel Duhamel HERNIA (K43.0) Impression: Pleasant active male with periumbilical hernia most likely a combination of incisional hernia from his robotic extraction point in umbilicus. Subtle reducible but somewhat incarcerated. Most likely fat-containing.  I think he would benefit from surgery to repair this given the fact he is getting some symptoms, it is increased in size, he is very active. He is  interested in proceeding. We'll try a laparoscopic repair with mesh. Most likely outpatient surgery. He agrees to proceed.  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 Most likely he will need to be out 6 weeks before he can do unrestricted activity that can be required for his job.   PREOP - [place] - ENCOUNTER FOR PREOPERATIVE EXAMINATION FOR GENERAL SURGICAL PROCEDURE (Z01.818)  Current Plans You are being scheduled for surgery- Our schedulers will call you.  You should hear from our office's scheduling department within 5 working days about the location, date, and time of surgery. We try to make accommodations for patient's preferences in scheduling surgery, but sometimes the OR schedule or the surgeon's schedule prevents [place] from making those accommodations.  If you have not heard from our office [phone] in 5 working days, call the office and ask for your surgeon's nurse.  If you have other questions about your diagnosis, plan, or surgery, call the office and ask for your surgeon's nurse.  Written instructions provided [facility] Consent - Hernia Repair - Ventral/Incisional/Umbilical (Fleta Borgeson): discussed with patient and provided information. Pt Education - [facility] Hernia Post-Op HCI (Bostyn Bogie): discussed with patient and provided information. Pt Education - [facility] Pain Control (Concettina Leth) Pt Education - Pamphlet Given - Laparoscopic Hernia Repair: discussed with patient and provided information. Pt Education - [facility] Mesh education: discussed with patient and provided information.  DIASTASIS RECTI (M62.08) Impression: Some diastases recti. Another reason to have underlay mesh reinforcement to minimize recurrence  Current Plans Pt Education - [facility] Diastasis Recti: discussed with patient and provided information.  Isaiah Hector, MD, FACS, MASCRS  Esophageal, Gastrointestinal & Colorectal Surgery Robotic and Minimally Invasive Surgery [facility] [address] [place], [place] [address] [phone] Fax [phone] Main/Paging  CONTACT INFORMATION: Weekday (9AM-5PM) concerns: Call [facility] main office at [phone] Weeknight (5PM-9AM) or Weekend/Holiday concerns: Check [email or web address] for General Surgery [facility] coverage (Please, do not use SecureChat as it is not reliable communication to operating surgeons for immediate patient care)

## 2021-01-24 NOTE — Progress Notes (Signed)
DUE TO COVID-19 ONLY ONE VISITOR IS ALLOWED TO COME WITH YOU AND STAY IN THE WAITING ROOM ONLY DURING PRE OP AND PROCEDURE DAY OF SURGERY. THE 1 VISITOR  MAY VISIT WITH YOU AFTER SURGERY IN YOUR PRIVATE ROOM DURING VISITING HOURS ONLY!  YOU NEED TO HAVE A COVID 19 TEST ON_______ @_______ , THIS TEST MUST BE DONE BEFORE SURGERY,  COVID TESTING SITE [address] [place] [address], IT IS ON THE RIGHT GOING OUT [address] APPROXIMATELY  2 MINUTES PAST [employer] ON THE RIGHT. ONCE YOUR COVID TEST IS COMPLETED,  PLEASE BEGIN THE QUARANTINE INSTRUCTIONS AS OUTLINED IN YOUR HANDOUT.                Isaiah Bush  01/24/2021   Your procedure is scheduled on: 02/06/2021    Report to [facility] Main  Entrance   Report to admitting at     0800 AM     Call this number if you have problems the morning of surgery [phone]    REMEMBER: NO  SOLID FOOD CANDY OR GUM AFTER MIDNIGHT. CLEAR LIQUIDS UNTIL    0700am        . NOTHING BY MOUTH EXCEPT CLEAR LIQUIDS UNTIL      0700am  . PLEASE FINISH ENSURE DRINK PER SURGEON ORDER  WHICH NEEDS TO BE COMPLETED AT  0700am     .      CLEAR LIQUID DIET   Foods Allowed                                                                    Coffee and tea, regular and decaf                            Fruit ices (not with fruit pulp)                                      Iced Popsicles                                    Carbonated beverages, regular and diet                                    Cranberry, grape and apple juices Sports drinks like Gatorade Lightly seasoned clear broth or consume(fat free) Sugar, honey syrup ___________________________________________________________________      BRUSH YOUR TEETH MORNING OF SURGERY AND RINSE YOUR MOUTH OUT, NO CHEWING GUM CANDY OR MINTS.     Take these medicines the morning of surgery with A SIP OF WATER:   none   DO NOT TAKE ANY DIABETIC MEDICATIONS DAY OF YOUR SURGERY                                You may not have any metal on your body including hair pins and  piercings  Do not wear jewelry, make-up, lotions, powders or perfumes, deodorant             Do not wear nail polish on your fingernails.  Do not shave  48 hours prior to surgery.              Men may shave face and neck.   Do not bring valuables to the hospital. [place].  Contacts, dentures or bridgework may not be worn into surgery.  Leave suitcase in the car. After surgery it may be brought to your room.     Patients discharged the day of surgery will not be allowed to drive home. IF YOU ARE HAVING SURGERY AND GOING HOME THE SAME DAY, YOU MUST HAVE AN ADULT TO DRIVE YOU HOME AND BE WITH YOU FOR 24 HOURS. YOU MAY GO HOME BY TAXI OR UBER OR ORTHERWISE, BUT AN ADULT MUST ACCOMPANY YOU HOME AND STAY WITH YOU FOR 24 HOURS.  Name and phone number of your driver:  Special Instructions: N/A              Please read over the following fact sheets you were given: _____________________________________________________________________  [facility] - Preparing for Surgery Before surgery, you can play an important role.  Because skin is not sterile, your skin needs to be as free of germs as possible.  You can reduce the number of germs on your skin by washing with CHG (chlorahexidine gluconate) soap before surgery.  CHG is an antiseptic cleaner which kills germs and bonds with the skin to continue killing germs even after washing. Please DO NOT use if you have an allergy to CHG or antibacterial soaps.  If your skin becomes reddened/irritated stop using the CHG and inform your nurse when you arrive at Short Stay. Do not shave (including legs and underarms) for at least 48 hours prior to the first CHG shower.  You may shave your face/neck. Please follow these instructions carefully:  1.  Shower with CHG Soap the night before surgery and the  morning of  Surgery.  2.  If you choose to wash your hair, wash your hair first as usual with your  normal  shampoo.  3.  After you shampoo, rinse your hair and body thoroughly to remove the  shampoo.                           4.  Use CHG as you would any other liquid soap.  You can apply chg directly  to the skin and wash                       Gently with a scrungie or clean washcloth.  5.  Apply the CHG Soap to your body ONLY FROM THE NECK DOWN.   Do not use on face/ open                           Wound or open sores. Avoid contact with eyes, ears mouth and genitals (private parts).                       Wash face,  Genitals (private parts) with your normal soap.             6.  Wash thoroughly, paying special attention to the area where your surgery  will be performed.  7.  Thoroughly rinse your body with warm water from the neck down.  8.  DO NOT shower/wash with your normal soap after using and rinsing off  the CHG Soap.                9.  Pat yourself dry with a clean towel.            10.  Wear clean pajamas.            11.  Place clean sheets on your bed the night of your first shower and do not  sleep with pets. Day of Surgery : Do not apply any lotions/deodorants the morning of surgery.  Please wear clean clothes to the hospital/surgery center.  FAILURE TO FOLLOW THESE INSTRUCTIONS MAY RESULT IN THE CANCELLATION OF YOUR SURGERY PATIENT SIGNATURE_________________________________  NURSE SIGNATURE__________________________________  ________________________________________________________________________

## 2021-01-30 ENCOUNTER — Encounter (HOSPITAL_COMMUNITY)
Admission: RE | Admit: 2021-01-30 | Discharge: 2021-01-30 | Disposition: A | Discharge status: Home / Self Care | Payer: BC Managed Care – PPO | Source: Ambulatory Visit | Attending: Family Medicine | Admitting: Family Medicine

## 2021-01-31 NOTE — Patient Instructions (Signed)
DUE TO COVID-19 ONLY ONE VISITOR IS ALLOWED TO COME WITH YOU AND STAY IN THE WAITING ROOM ONLY DURING PRE OP AND PROCEDURE DAY OF SURGERY. THE 1 VISITOR  MAY VISIT WITH YOU AFTER SURGERY IN YOUR PRIVATE ROOM DURING VISITING HOURS ONLY!                  Isaiah Bush     Your procedure is scheduled on: 02/06/21   Report to [facility] Main  Entrance   Report to admitting at  8:00 AM     Call this number if you have problems the morning of surgery [place], NO CHEWING GUM [place].   No food after midnight.    You may have clear liquid until 7:00 AM.    At 6:30 AM drink pre surgery drink.   Nothing by mouth after 7:00 AM.    Take these medicines the morning of surgery with A SIP OF WATER: none                                You may not have any metal on your body including             piercings  Do not wear jewelry,  lotions, powders or deodorant                      Men may shave face and neck.   Do not bring valuables to the hospital. [place].  Contacts, dentures or bridgework may not be worn into surgery.       Patients discharged the day of surgery will not be allowed to drive home.   IF YOU ARE HAVING SURGERY AND GOING HOME THE SAME DAY, YOU MUST HAVE AN ADULT TO DRIVE YOU HOME AND BE WITH YOU FOR 24 HOURS.  YOU MAY GO HOME BY TAXI OR UBER OR ORTHERWISE, BUT AN ADULT MUST ACCOMPANY YOU HOME AND STAY WITH YOU FOR 24 HOURS.  Name and phone number of your driver:  Special Instructions: N/A              Please read over the following fact sheets you were given: _____________________________________________________________________             [facility] - Preparing for Surgery Before surgery, you can play an important role.  Because skin is not sterile, your skin needs to be as free of germs as possible.  You can reduce the number of  germs on your skin by washing with CHG (chlorahexidine gluconate) soap before surgery.  CHG is an antiseptic cleaner which kills germs and bonds with the skin to continue killing germs even after washing. Please DO NOT use if you have an allergy to CHG or antibacterial soaps.  If your skin becomes reddened/irritated stop using the CHG and inform your nurse when you arrive at Short Stay.  You may shave your face/neck.  Please follow these instructions carefully:  1.  Shower with CHG Soap the night before surgery and the  morning of Surgery.  2.  If you choose to wash your hair, wash your hair first as usual with your  normal  shampoo.  3.  After you shampoo, rinse your hair  and body thoroughly to remove the  shampoo.                                        4.  Use CHG as you would any other liquid soap.  You can apply chg directly  to the skin and wash                       Gently with a scrungie or clean washcloth.  5.  Apply the CHG Soap to your body ONLY FROM THE NECK DOWN.   Do not use on face/ open                           Wound or open sores. Avoid contact with eyes, ears mouth and genitals (private parts).                       Wash face,  Genitals (private parts) with your normal soap.             6.  Wash thoroughly, paying special attention to the area where your surgery  will be performed.  7.  Thoroughly rinse your body with warm water from the neck down.  8.  DO NOT shower/wash with your normal soap after using and rinsing off  the CHG Soap.             9.  Pat yourself dry with a clean towel.            10.  Wear clean pajamas.            11.  Place clean sheets on your bed the night of your first shower and do not  sleep with pets. Day of Surgery : Do not apply any lotions/deodorants the morning of surgery.  Please wear clean clothes to the hospital/surgery center.  FAILURE TO FOLLOW THESE INSTRUCTIONS MAY RESULT IN THE CANCELLATION OF YOUR SURGERY PATIENT  SIGNATURE_________________________________  NURSE SIGNATURE__________________________________  ________________________________________________________________________

## 2021-02-03 ENCOUNTER — Other Ambulatory Visit: Payer: Self-pay

## 2021-02-03 ENCOUNTER — Encounter (HOSPITAL_COMMUNITY): Payer: Self-pay

## 2021-02-03 ENCOUNTER — Encounter (HOSPITAL_COMMUNITY)
Admission: RE | Admit: 2021-02-03 | Discharge: 2021-02-03 | Disposition: A | Discharge status: Home / Self Care | Payer: BC Managed Care – PPO | Source: Ambulatory Visit | Attending: Surgery | Admitting: Surgery

## 2021-02-03 DIAGNOSIS — Z01812 Encounter for preprocedural laboratory examination: Secondary | ICD-10-CM | POA: Insufficient documentation

## 2021-02-03 DIAGNOSIS — N529 Male erectile dysfunction, unspecified: Secondary | ICD-10-CM | POA: Diagnosis not present

## 2021-02-03 DIAGNOSIS — M6208 Separation of muscle (nontraumatic), other site: Secondary | ICD-10-CM | POA: Diagnosis not present

## 2021-02-03 DIAGNOSIS — Z8546 Personal history of malignant neoplasm of prostate: Secondary | ICD-10-CM | POA: Diagnosis not present

## 2021-02-03 DIAGNOSIS — K430 Incisional hernia with obstruction, without gangrene: Secondary | ICD-10-CM | POA: Diagnosis present

## 2021-02-03 DIAGNOSIS — Z9079 Acquired absence of other genital organ(s): Secondary | ICD-10-CM | POA: Diagnosis not present

## 2021-02-03 LAB — CBC
HCT: 42.8 % (ref 39.0–52.0)
Hemoglobin: 14.9 g/dL (ref 13.0–17.0)
MCH: 30.8 pg (ref 26.0–34.0)
MCHC: 34.8 g/dL (ref 30.0–36.0)
MCV: 88.6 fL (ref 80.0–100.0)
Platelets: 306 10*3/uL (ref 150–400)
RBC: 4.83 MIL/uL (ref 4.22–5.81)
RDW: 12.3 % (ref 11.5–15.5)
WBC: 6.4 10*3/uL (ref 4.0–10.5)
nRBC: 0 % (ref 0.0–0.2)

## 2021-02-03 MED ORDER — PHENYLEPHRINE HCL (PRESSORS) 10 MG/ML IV SOLN
INTRAVENOUS | Status: AC
  Filled 2021-02-03: qty 1

## 2021-02-03 NOTE — Progress Notes (Signed)
COVID Vaccine Completed:Yes  Date COVID Vaccine completed:09/06/19-Booster 05/03/20, 01/10/21 COVID vaccine manufacturer: Pfizer      PCP - Dr. Barbaraann Cao [place] 05/22/20 Cardiologist -none   Chest x-ray - no EKG - no Stress Test - no ECHO - no Cardiac Cath - no Pacemaker/ICD device last checked:NA  Sleep Study - no CPAP -   Fasting Blood Sugar - NA Checks Blood Sugar _____ times a day  Blood Thinner Instructions:NA Aspirin Instructions: Last Dose:  Anesthesia review: no  Patient denies shortness of breath, fever, cough and chest pain at PAT appointment Pt works out regularly and has no SOB with any activities.  Patient verbalized understanding of instructions that were given to them at the PAT appointment. Patient was also instructed that they will need to review over the PAT instructions again at home before surgery. yes

## 2021-02-05 MED ORDER — BUPIVACAINE LIPOSOME 1.3 % IJ SUSP
20.0000 mL | Freq: Once | INTRAMUSCULAR | Status: DC
  Filled 2021-02-05: qty 20

## 2021-02-06 ENCOUNTER — Ambulatory Visit (HOSPITAL_COMMUNITY): Payer: BC Managed Care – PPO | Admitting: Certified Registered Nurse Anesthetist

## 2021-02-06 ENCOUNTER — Ambulatory Visit (HOSPITAL_COMMUNITY)
Admission: RE | Admit: 2021-02-06 | Discharge: 2021-02-06 | Disposition: A | Discharge status: Home / Self Care | Payer: BC Managed Care – PPO | Attending: Surgery | Admitting: Surgery

## 2021-02-06 ENCOUNTER — Encounter (HOSPITAL_COMMUNITY)
Admission: RE | Disposition: A | Discharge status: Home / Self Care | Payer: Self-pay | Source: Home / Self Care | Attending: Surgery

## 2021-02-06 ENCOUNTER — Encounter (HOSPITAL_COMMUNITY): Payer: Self-pay | Admitting: Surgery

## 2021-02-06 DIAGNOSIS — N529 Male erectile dysfunction, unspecified: Secondary | ICD-10-CM | POA: Insufficient documentation

## 2021-02-06 DIAGNOSIS — Z8546 Personal history of malignant neoplasm of prostate: Secondary | ICD-10-CM | POA: Insufficient documentation

## 2021-02-06 DIAGNOSIS — M6208 Separation of muscle (nontraumatic), other site: Secondary | ICD-10-CM | POA: Insufficient documentation

## 2021-02-06 DIAGNOSIS — K430 Incisional hernia with obstruction, without gangrene: Secondary | ICD-10-CM | POA: Diagnosis not present

## 2021-02-06 DIAGNOSIS — Z9079 Acquired absence of other genital organ(s): Secondary | ICD-10-CM | POA: Insufficient documentation

## 2021-02-06 HISTORY — PX: VENTRAL HERNIA REPAIR: SHX424

## 2021-02-06 SURGERY — REPAIR, HERNIA, VENTRAL, LAPAROSCOPIC
Anesthesia: General

## 2021-02-06 MED ORDER — ONDANSETRON HCL 4 MG/2ML IJ SOLN
INTRAMUSCULAR | Status: DC | PRN
  Administered 2021-02-06: 4 mg via INTRAVENOUS

## 2021-02-06 MED ORDER — CHLORHEXIDINE GLUCONATE CLOTH 2 % EX PADS: 6.0000 | MEDICATED_PAD | Freq: Once | CUTANEOUS | Status: DC

## 2021-02-06 MED ORDER — ACETAMINOPHEN 500 MG PO TABS
1000.0000 mg | ORAL_TABLET | ORAL | Status: AC
  Administered 2021-02-06: 1000 mg via ORAL
  Filled 2021-02-06: qty 2

## 2021-02-06 MED ORDER — METHOCARBAMOL 750 MG PO TABS: 750.0000 mg | ORAL_TABLET | Freq: Four times a day (QID) | ORAL | 2 refills | Status: AC | PRN

## 2021-02-06 MED ORDER — FENTANYL CITRATE (PF) 100 MCG/2ML IJ SOLN
INTRAMUSCULAR | Status: AC
  Filled 2021-02-06: qty 2

## 2021-02-06 MED ORDER — SODIUM CHLORIDE 0.9 % IV SOLN: 250.0000 mL | INTRAVENOUS | Status: DC | PRN

## 2021-02-06 MED ORDER — EPHEDRINE SULFATE-NACL 50-0.9 MG/10ML-% IV SOSY
PREFILLED_SYRINGE | INTRAVENOUS | Status: DC | PRN
  Administered 2021-02-06: 5 mg via INTRAVENOUS
  Administered 2021-02-06 (×2): 10 mg via INTRAVENOUS

## 2021-02-06 MED ORDER — ROCURONIUM BROMIDE 10 MG/ML (PF) SYRINGE
PREFILLED_SYRINGE | INTRAVENOUS | Status: DC | PRN
  Administered 2021-02-06: 60 mg via INTRAVENOUS
  Administered 2021-02-06: 10 mg via INTRAVENOUS
  Administered 2021-02-06: 20 mg via INTRAVENOUS

## 2021-02-06 MED ORDER — DEXAMETHASONE SODIUM PHOSPHATE 10 MG/ML IJ SOLN
INTRAMUSCULAR | Status: DC | PRN
  Administered 2021-02-06: 10 mg via INTRAVENOUS

## 2021-02-06 MED ORDER — LACTATED RINGERS IR SOLN
Status: DC | PRN
  Administered 2021-02-06: 1000 mL

## 2021-02-06 MED ORDER — ENSURE PRE-SURGERY PO LIQD: 296.0000 mL | Freq: Once | ORAL | Status: DC

## 2021-02-06 MED ORDER — SODIUM CHLORIDE 0.9 % IV SOLN
2.0000 g | INTRAVENOUS | Status: AC
  Administered 2021-02-06: 2 g via INTRAVENOUS
  Filled 2021-02-06: qty 2

## 2021-02-06 MED ORDER — FENTANYL CITRATE (PF) 100 MCG/2ML IJ SOLN
INTRAMUSCULAR | Status: DC | PRN
  Administered 2021-02-06 (×5): 50 ug via INTRAVENOUS

## 2021-02-06 MED ORDER — MIDAZOLAM HCL 2 MG/2ML IJ SOLN
INTRAMUSCULAR | Status: AC
  Filled 2021-02-06: qty 2

## 2021-02-06 MED ORDER — PROMETHAZINE HCL 25 MG/ML IJ SOLN: 6.2500 mg | INTRAMUSCULAR | Status: DC | PRN

## 2021-02-06 MED ORDER — PROPOFOL 10 MG/ML IV BOLUS
INTRAVENOUS | Status: AC
  Filled 2021-02-06: qty 20

## 2021-02-06 MED ORDER — 0.9 % SODIUM CHLORIDE (POUR BTL) OPTIME
TOPICAL | Status: DC | PRN
  Administered 2021-02-06: 1000 mL

## 2021-02-06 MED ORDER — ENSURE PRE-SURGERY PO LIQD: 592.0000 mL | Freq: Once | ORAL | Status: DC

## 2021-02-06 MED ORDER — FENTANYL CITRATE (PF) 100 MCG/2ML IJ SOLN: 25.0000 ug | INTRAMUSCULAR | Status: DC | PRN

## 2021-02-06 MED ORDER — PROPOFOL 10 MG/ML IV BOLUS
INTRAVENOUS | Status: DC | PRN
  Administered 2021-02-06: 150 mg via INTRAVENOUS
  Administered 2021-02-06: 50 mg via INTRAVENOUS

## 2021-02-06 MED ORDER — BUPIVACAINE-EPINEPHRINE 0.25% -1:200000 IJ SOLN
INTRAMUSCULAR | Status: DC | PRN
  Administered 2021-02-06: 50 mL

## 2021-02-06 MED ORDER — CELECOXIB 200 MG PO CAPS
200.0000 mg | ORAL_CAPSULE | ORAL | Status: AC
  Administered 2021-02-06: 200 mg via ORAL
  Filled 2021-02-06: qty 1

## 2021-02-06 MED ORDER — LACTATED RINGERS IV SOLN: INTRAVENOUS | Status: DC

## 2021-02-06 MED ORDER — GABAPENTIN 300 MG PO CAPS
300.0000 mg | ORAL_CAPSULE | ORAL | Status: AC
  Administered 2021-02-06: 300 mg via ORAL
  Filled 2021-02-06: qty 1

## 2021-02-06 MED ORDER — ONDANSETRON HCL 4 MG/2ML IJ SOLN
INTRAMUSCULAR | Status: AC
  Filled 2021-02-06: qty 2

## 2021-02-06 MED ORDER — ORAL CARE MOUTH RINSE
15.0000 mL | Freq: Once | OROMUCOSAL | Status: AC
Start: 2021-02-06 — End: 2021-02-06

## 2021-02-06 MED ORDER — ROCURONIUM BROMIDE 10 MG/ML (PF) SYRINGE
PREFILLED_SYRINGE | INTRAVENOUS | Status: AC
  Filled 2021-02-06: qty 10

## 2021-02-06 MED ORDER — SUGAMMADEX SODIUM 200 MG/2ML IV SOLN
INTRAVENOUS | Status: DC | PRN
  Administered 2021-02-06: 200 mg via INTRAVENOUS

## 2021-02-06 MED ORDER — LABETALOL HCL 5 MG/ML IV SOLN: 10.0000 mg | Freq: Once | INTRAVENOUS | Status: AC | PRN

## 2021-02-06 MED ORDER — LABETALOL HCL 5 MG/ML IV SOLN
INTRAVENOUS | Status: AC
  Administered 2021-02-06: 10 mg via INTRAVENOUS
  Filled 2021-02-06: qty 4

## 2021-02-06 MED ORDER — LIDOCAINE 2% (20 MG/ML) 5 ML SYRINGE
INTRAMUSCULAR | Status: DC | PRN
  Administered 2021-02-06: 90 mg via INTRAVENOUS

## 2021-02-06 MED ORDER — BUPIVACAINE LIPOSOME 1.3 % IJ SUSP
INTRAMUSCULAR | Status: DC | PRN
  Administered 2021-02-06: 20 mL

## 2021-02-06 MED ORDER — SODIUM CHLORIDE 0.9% FLUSH: 3.0000 mL | Freq: Two times a day (BID) | INTRAVENOUS | Status: DC

## 2021-02-06 MED ORDER — CHLORHEXIDINE GLUCONATE 0.12 % MT SOLN
15.0000 mL | Freq: Once | OROMUCOSAL | Status: AC
  Administered 2021-02-06: 15 mL via OROMUCOSAL

## 2021-02-06 MED ORDER — BUPIVACAINE-EPINEPHRINE 0.25% -1:200000 IJ SOLN
INTRAMUSCULAR | Status: AC
  Filled 2021-02-06: qty 1

## 2021-02-06 MED ORDER — SODIUM CHLORIDE 0.9% FLUSH: 3.0000 mL | INTRAVENOUS | Status: DC | PRN

## 2021-02-06 MED ORDER — EPHEDRINE 5 MG/ML INJ
INTRAVENOUS | Status: AC
  Filled 2021-02-06: qty 5

## 2021-02-06 MED ORDER — OXYCODONE HCL 5 MG PO TABS: 5.0000 mg | ORAL_TABLET | Freq: Four times a day (QID) | ORAL | 0 refills | Status: AC | PRN

## 2021-02-06 MED ORDER — DEXAMETHASONE SODIUM PHOSPHATE 10 MG/ML IJ SOLN
INTRAMUSCULAR | Status: AC
  Filled 2021-02-06: qty 1

## 2021-02-06 MED ORDER — MIDAZOLAM HCL 5 MG/5ML IJ SOLN
INTRAMUSCULAR | Status: DC | PRN
  Administered 2021-02-06: 2 mg via INTRAVENOUS

## 2021-02-06 SURGICAL SUPPLY — 47 items
APL PRP STRL LF DISP 70% ISPRP (MISCELLANEOUS) ×1
APPLIER CLIP 5 13 M/L LIGAMAX5 (MISCELLANEOUS)
APR CLP MED LRG 5 ANG JAW (MISCELLANEOUS)
BAG COUNTER SPONGE SURGICOUNT (BAG) ×2 IMPLANT
BAG SPNG CNTER NS LX DISP (BAG) ×1
BAG SURGICOUNT SPONGE COUNTING (BAG) ×1
BINDER ABDOMINAL 12 ML 46-62 (SOFTGOODS) ×3 IMPLANT
CABLE HIGH FREQUENCY MONO STRZ (ELECTRODE) ×3 IMPLANT
CHLORAPREP W/TINT 26 (MISCELLANEOUS) ×3 IMPLANT
CLIP APPLIE 5 13 M/L LIGAMAX5 (MISCELLANEOUS) IMPLANT
CLOSURE WOUND 1/2 X4 (GAUZE/BANDAGES/DRESSINGS) ×2
COVER SURGICAL LIGHT HANDLE (MISCELLANEOUS) ×3 IMPLANT
DECANTER SPIKE VIAL GLASS SM (MISCELLANEOUS) ×3 IMPLANT
DEVICE SECURE STRAP 25 ABSORB (INSTRUMENTS) ×3 IMPLANT
DEVICE TROCAR PUNCTURE CLOSURE (ENDOMECHANICALS) ×3 IMPLANT
DRAPE WARM FLUID 44X44 (DRAPES) ×3 IMPLANT
DRSG TEGADERM 2-3/8X2-3/4 SM (GAUZE/BANDAGES/DRESSINGS) ×9 IMPLANT
DRSG TEGADERM 4X4.75 (GAUZE/BANDAGES/DRESSINGS) ×3 IMPLANT
ELECT REM PT RETURN 15FT ADLT (MISCELLANEOUS) ×3 IMPLANT
GAUZE SPONGE 2X2 8PLY STRL LF (GAUZE/BANDAGES/DRESSINGS) ×1 IMPLANT
GLOVE SURG LTX SZ8 (GLOVE) ×3 IMPLANT
GLOVE SURG UNDER LTX SZ8 (GLOVE) ×3 IMPLANT
GOWN STRL REUS W/TWL XL LVL3 (GOWN DISPOSABLE) ×6 IMPLANT
IRRIG SUCT STRYKERFLOW 2 WTIP (MISCELLANEOUS) ×3
IRRIGATION SUCT STRKRFLW 2 WTP (MISCELLANEOUS) ×1 IMPLANT
KIT BASIN OR (CUSTOM PROCEDURE TRAY) ×3 IMPLANT
KIT TURNOVER KIT A (KITS) ×3 IMPLANT
MARKER SKIN DUAL TIP RULER LAB (MISCELLANEOUS) ×3 IMPLANT
MESH VENTRALIGHT ST 8X10 (Mesh General) ×3 IMPLANT
NEEDLE SPNL 22GX3.5 QUINCKE BK (NEEDLE) IMPLANT
PAD POSITIONING PINK XL (MISCELLANEOUS) ×3 IMPLANT
PENCIL SMOKE EVACUATOR (MISCELLANEOUS) IMPLANT
SCISSORS LAP 5X35 DISP (ENDOMECHANICALS) ×3 IMPLANT
SET TUBE SMOKE EVAC HIGH FLOW (TUBING) ×3 IMPLANT
SLEEVE ADV FIXATION 5X100MM (TROCAR) ×3 IMPLANT
SPONGE GAUZE 2X2 8PLY STER LF (GAUZE/BANDAGES/DRESSINGS) ×1
SPONGE GAUZE 2X2 8PLY STRL LF (GAUZE/BANDAGES/DRESSINGS) ×2 IMPLANT
SPONGE GAUZE 2X2 STER 10/PKG (GAUZE/BANDAGES/DRESSINGS) ×2
STRIP CLOSURE SKIN 1/2X4 (GAUZE/BANDAGES/DRESSINGS) ×4 IMPLANT
SUT MNCRL AB 4-0 PS2 18 (SUTURE) ×6 IMPLANT
SUT PDS AB 1 CT1 27 (SUTURE) ×9 IMPLANT
SUT PROLENE 1 CT 1 30 (SUTURE) ×15 IMPLANT
TOWEL OR 17X26 10 PK STRL BLUE (TOWEL DISPOSABLE) ×3 IMPLANT
TRAY LAPAROSCOPIC (CUSTOM PROCEDURE TRAY) ×3 IMPLANT
TROCAR ADV FIXATION 11X100MM (TROCAR) IMPLANT
TROCAR ADV FIXATION 5X100MM (TROCAR) ×3 IMPLANT
TROCAR BLADELESS OPT 5 100 (ENDOMECHANICALS) ×3 IMPLANT

## 2021-02-06 NOTE — Anesthesia Postprocedure Evaluation (Signed)
Anesthesia Post Note  Patient: Isaiah Bush  Procedure(s) Performed: [place] WITH MESH     Patient location during evaluation: PACU Anesthesia Type: General Level of consciousness: awake and alert Pain management: pain level controlled Vital Signs Assessment: post-procedure vital signs reviewed and stable Respiratory status: spontaneous breathing, nonlabored ventilation, respiratory function stable and patient connected to nasal cannula oxygen Cardiovascular status: blood pressure returned to baseline and stable Postop Assessment: no apparent nausea or vomiting Anesthetic complications: no   No notable events documented.  Last Vitals:  Vitals:   02/06/21 1615 02/06/21 1630  BP: (!) 132/93 (!) 149/97  Pulse: 75 73  Resp: 13 12  Temp: 36.6 C 36.6 C  SpO2: 94% 99%    Last Pain:  Vitals:   02/06/21 1630  TempSrc:   PainSc: [name]

## 2021-02-06 NOTE — H&P (Signed)
02/06/2021     Isaiah Bush DOB: 1961/03/23 Single / Language: Cleophus Molt / Race: White Male   Patient Care Team: Dion Body, MD as PCP - General (Family Medicine) Michael Boston, MD as Consulting Physician (General Surgery) Raynelle Bring, MD as Consulting Physician (Urology)  Patient sent for surgical consultation at the request of Dutch Gray, MD   Chief Complaint: Umbilical hernia ` ` The patient is a prostate cancer survivor status post robotic radical prostatectomy 2018 by Dr. Raynelle Bring.  Has been followed by him in the survivable pathway.  Umbilical hernia noted on examination more recently.  Surgical consultation offered.  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  Moderate activity.  He is moderately physically active.  He has noticed a bulge around his bellybutton for the past 2 years.  He thinks it is gotten larger.  He is not having any severe painful episodes.  He does get pain or discomfort when he is exercising more turning or twisting.  Basic concerns surgical evaluation offered.  He does not smoke.  No diabetes.  Moves his bowels once or twice a day.  No sleep apnea.  Not immunosuppressed.  No other abdominal surgeries site from his radical prostatectomy.  He has some very mild urinary incontinence/leakage.  Some mild erectile dysfunction which is wished to be handled with medication.  Ready for surgery   (Review of systems as stated in this history (HPI) or in the review of systems.  Otherwise all other 12 point ROS are negative) ` ` ###########################################`   This patient encounter took 25 minutes today to perform the following: obtain history, perform exam, review outside records, interpret tests & imaging, counsel the patient on their diagnosis; and, document this encounter, including findings & plan in the electronic health record (EHR).     Past Surgical History Janeann Forehand, CNA; 12/20/2020 9:02 AM) Prostate Surgery - Removal     Diagnostic Studies History Janeann Forehand, CNA; 12/20/2020 9:02 AM) Colonoscopy  1-5 years ago   Allergies Janeann Forehand, CNA; 12/20/2020 9:02 AM) No Known Drug Allergies  [12/20/2020]: Allergies Reconciled    Medication History Janeann Forehand, CNA; 12/20/2020 9:03 AM) No Current Medications  Medications Reconciled    Social History Janeann Forehand, CNA; 12/20/2020 9:02 AM) Alcohol use  Occasional alcohol use. Caffeine use  Carbonated beverages. No drug use  Tobacco use  Never smoker.   Family History Janeann Forehand, CNA; 12/20/2020 9:02 AM) Alcohol Abuse  Father. Heart Disease  Mother.   Other Problems Janeann Forehand, CNA; 12/20/2020 9:02 AM) Migraine Headache  Prostate Cancer  Umbilical Hernia Repair          Review of Systems (Donyelle Alston CNA; 12/20/2020 9:02 AM) General Not Present- Appetite Loss, Chills, Fatigue, Fever, Night Sweats, Weight Gain and Weight Loss. Skin Not Present- Change in Wart/Mole, Dryness, Hives, Jaundice, New Lesions, Non-Healing Wounds, Rash and Ulcer. HEENT Present- Visual Disturbances and Wears glasses/contact lenses. Not Present- Earache, Hearing Loss, Hoarseness, Nose Bleed, Oral Ulcers, Ringing in the Ears, Seasonal Allergies, Sinus Pain, Sore Throat and Yellow Eyes. Respiratory Not Present- Bloody sputum, Chronic Cough, Difficulty Breathing, Snoring and Wheezing. Cardiovascular Not Present- Chest Pain, Difficulty Breathing Lying Down, Leg Cramps, Palpitations, Rapid Heart Rate, Shortness of Breath and Swelling of Extremities. Gastrointestinal Not Present- Abdominal Pain, Bloating, Bloody Stool, Change in Bowel Habits, Chronic diarrhea, Constipation, Difficulty Swallowing, Excessive gas, Gets full quickly at meals, Hemorrhoids, Indigestion, Nausea, Rectal Pain and Vomiting. Male Genitourinary Present- Urine Leakage. Not Present- Blood in Urine,  Change in Urinary Stream, Frequency, Impotence, Nocturia, Painful Urination and  Urgency. Musculoskeletal Not Present- Back Pain, Joint Pain, Joint Stiffness, Muscle Pain, Muscle Weakness and Swelling of Extremities. Neurological Not Present- Decreased Memory, Fainting, Headaches, Numbness, Seizures, Tingling, Tremor, Trouble walking and Weakness. Psychiatric Not Present- Anxiety, Bipolar, Change in Sleep Pattern, Depression, Fearful and Frequent crying. Endocrine Not Present- Cold Intolerance, Excessive Hunger, Hair Changes, Heat Intolerance, Hot flashes and New Diabetes. Hematology Not Present- Blood Thinners, Easy Bruising, Excessive bleeding, Gland problems, HIV and Persistent Infections.   Vitals (Donyelle Alston CNA; 12/20/2020 9:03 AM) 12/20/2020 9:03 AM Weight: 203.13 lb   Height: 71 in  Body Surface Area: 2.12 m   Body Mass Index: 28.33 kg/m   Temp.: 98.1 F    Pulse: 77 (Regular)    P.OX: 97% (Room air) BP: 136/80(Sitting, Left Arm, Standard)   02/06/2021 BP (!) 177/105   Pulse 73   Temp 97.8 F (36.6 C) (Oral)   Resp 20   Ht 5\' 11"  (1.803 m)   Wt 90.3 kg   SpO2 99%   BMI 27.75 kg/m               Physical Exam  General Mental Status - Alert. General Appearance - Not in acute distress, Not Sickly. Orientation - Oriented X3. Hydration - Well hydrated. Voice - Normal.   Integumentary Global Assessment Upon inspection and palpation of skin surfaces of the - Axillae: non-tender, no inflammation or ulceration, no drainage. and Distribution of scalp and body hair is normal. General Characteristics Temperature - normal warmth is noted.   Head and Neck Head - normocephalic, atraumatic with no lesions or palpable masses. Face Global Assessment - atraumatic, no absence of expression. Neck Global Assessment - no abnormal movements, no bruit auscultated on the right, no bruit auscultated on the left, no decreased range of motion, non-tender. Trachea - midline. Thyroid Gland Characteristics - non-tender.   Eye Eyeball - Left - Extraocular  movements intact, No Nystagmus - Left. Eyeball - Right - Extraocular movements intact, No Nystagmus - Right. Cornea - Left - No Hazy - Left. Cornea - Right - No Hazy - Right. Sclera/Conjunctiva - Left - No scleral icterus, No Discharge - Left. Sclera/Conjunctiva - Right - No scleral icterus, No Discharge - Right. Pupil - Left - Direct reaction to light normal. Pupil - Right - Direct reaction to light normal.   ENMT Ears Pinna - Left - no drainage observed, no generalized tenderness observed. Pinna - Right - no drainage observed, no generalized tenderness observed. Nose and Sinuses External Inspection of the Nose - no destructive lesion observed. Inspection of the nares - Left - quiet respiration. Inspection of the nares - Right - quiet respiration. Mouth and Throat Lips - Upper Lip - no fissures observed, no pallor noted. Lower Lip - no fissures observed, no pallor noted. Nasopharynx - no discharge present. Oral Cavity/Oropharynx - Tongue - no dryness observed. Oral Mucosa - no cyanosis observed. Hypopharynx - no evidence of airway distress observed.   Chest and Lung Exam Inspection Movements - Normal and Symmetrical. Accessory muscles - No use of accessory muscles in breathing. Palpation Palpation of the chest reveals - Non-tender. Auscultation Breath sounds - Normal and Clear.   Cardiovascular Auscultation Rhythm - Regular. Murmurs & Other Heart Sounds - Auscultation of the heart reveals - No Murmurs and No Systolic Clicks.   Abdomen Inspection Inspection of the abdomen reveals - No Visible peristalsis and No Abnormal pulsations. Umbilicus - No Bleeding, No  Urine drainage. Palpation/Percussion Palpation and Percussion of the abdomen reveal - Soft, Non Tender, No Rebound tenderness, No Rigidity (guarding) and No Cutaneous hyperesthesia. Note:    Mild supraorbital diastases recti. Supraumbilical bulging 5 x 4 cm region somewhat reducible. It seems between umbilicus and his right  paramedian robotic incision. Consistent with hernia.     Abdomen soft.  Nontender.  Not distended.  No guarding.   Male Genitourinary Sexual Maturity Tanner 5 - Adult hair pattern and Adult penile size and shape.   Peripheral Vascular Upper Extremity Inspection - Left - No Cyanotic nailbeds - Left, Not Ischemic. Inspection - Right - No Cyanotic nailbeds - Right, Not Ischemic.   Neurologic Neurologic evaluation reveals  - normal attention span and ability to concentrate, able to name objects and repeat phrases. Appropriate fund of knowledge , normal sensation and normal coordination. Mental Status Affect - not angry, not paranoid. Cranial Nerves - Normal Bilaterally. Gait - Normal.   Neuropsychiatric Mental status exam performed with findings of - able to articulate well with normal speech/language, rate, volume and coherence, thought content normal with ability to perform basic computations and apply abstract reasoning and no evidence of hallucinations, delusions, obsessions or homicidal/suicidal ideation.   Musculoskeletal Global Assessment Spine, Ribs and Pelvis - no instability, subluxation or laxity. Right Upper Extremity - no instability, subluxation or laxity.   Lymphatic Head & Neck   General Head & Neck Lymphatics: Bilateral - Description - No Localized lymphadenopathy. Axillary   General Axillary Region: Bilateral - Description - No Localized lymphadenopathy. Femoral & Inguinal   Generalized Femoral & Inguinal Lymphatics: Left - Description - No Localized lymphadenopathy. Right - Description - No Localized lymphadenopathy.       Assessment & Plan  INCARCERATED INCISIONAL HERNIA (K43.0) Impression: Pleasant active male with periumbilical hernia most likely a combination of incisional hernia from his robotic extraction point in umbilicus. Subtle reducible but somewhat incarcerated. Most likely fat-containing.   I think he would benefit from surgery to repair this  given the fact he is getting some symptoms, it is increased in size, he is very active. He is interested in proceeding. We'll try a laparoscopic repair with mesh. Most likely outpatient surgery. He agrees to proceed.   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 Most likely he will need to be out 6 weeks before he can do unrestricted activity that can be required for his job.   The anatomy & physiology of the abdominal wall was discussed.  The pathophysiology of hernias was discussed.  Natural history risks without surgery including progeressive enlargement, pain, incarceration, & strangulation was discussed.   Contributors to complications such as smoking, obesity, diabetes, prior surgery, etc were discussed.   I feel the risks of no intervention will lead to serious problems that outweigh the operative risks; therefore, I recommended surgery to reduce and repair the hernia.  I explained laparoscopic techniques with possible need for an open approach.  I noted the probable use of mesh to patch and/or buttress the hernia repair  Risks such as bleeding, infection, abscess, need for further treatment, injury to other organs, need for repair of tissues / organs, stroke, heart attack, death, and other risks were discussed.  I noted a good likelihood this will help address the problem.   Goals of post-operative recovery were discussed as well.  Possibility that this will not correct all symptoms was explained.  I stressed the importance of low-impact activity, aggressive pain control, avoiding constipation, &  not pushing through pain to minimize risk of post-operative chronic pain or injury. Possibility of reherniation especially with smoking, obesity, diabetes, immunosuppression, and other health conditions was discussed.  We will work to minimize complications.     An educational handout further explaining the pathology & treatment options was given as well.  Questions were answered.  The  patient expresses understanding & wishes to proceed with surgery.         DIASTASIS RECTI (M62.08) Impression: Some diastases recti. Another reason to have underlay mesh reinforcement to minimize recurrence   Current Plans Pt Education - [facility] Diastasis Recti: discussed with patient and provided information.   Adin Hector, MD, FACS, MASCRS Esophageal, Gastrointestinal & Colorectal Surgery Robotic and Minimally Invasive Surgery  [facility], [place]  [place]. [address], [place], [place] [address] [phone] Fax [phone] Main  CONTACT INFORMATION:  Weekday (9AM-5PM): Call [facility] main office at [phone]  Weeknight (5PM-9AM) or Weekend/Holiday: Check [email or web address] (password " [email or web address]") for General Surgery [facility] coverage  (Please, do not use SecureChat as it is not reliable communication to operating surgeons for immediate patient care)

## 2021-02-06 NOTE — Transfer of Care (Signed)
Immediate Anesthesia Transfer of Care Note  Patient: Joan Flores  Procedure(s) Performed: LAPAROSCOPIC VENTRAL WALL HERNIA REPAIR WITH MESH  Patient Location: PACU  Anesthesia Type:General  Level of Consciousness: drowsy and patient cooperative  Airway & Oxygen Therapy: Patient Spontanous Breathing and Patient connected to face mask oxygen  Post-op Assessment: Report given to RN and Post -op Vital signs reviewed and stable  Post vital signs: Reviewed and stable  Last Vitals:  Vitals Value Taken Time  BP 151/96 02/06/21 1450  Temp    Pulse 88 02/06/21 1452  Resp 16 02/06/21 1452  SpO2 100 % 02/06/21 1452  Vitals shown include unvalidated device data.  Last Pain:  Vitals:   02/06/21 0837  TempSrc:   PainSc: 0-No pain      Patients Stated Pain Goal: 4 (05/19/58 4585)  Complications: No notable events documented.

## 2021-02-06 NOTE — Interval H&P Note (Signed)
History and Physical Interval Note:  02/06/2021 11:39 AM  Isaiah Bush  has presented today for surgery, with the diagnosis of [place].  The various methods of treatment have been discussed with the patient and family. After consideration of risks, benefits and other options for treatment, the patient has consented to  Procedure(s): [place] (N/A) as a surgical intervention.  The patient's history has been reviewed, patient examined, no change in status, stable for surgery.  I have reviewed the patient's chart and labs.  Questions were answered to the patient's satisfaction.    I have re-reviewed the the patient's records, history, medications, and allergies.  I have re-examined the patient.  I again discussed intraoperative plans and goals of post-operative recovery.  The patient agrees to proceed.  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    Past Medical History:  Diagnosis Date   BPH (benign prostatic hyperplasia)    Cancer [facility])    prostate cancer   Elevated PSA     Past Surgical History:  Procedure Laterality Date   COLONOSCOPY  2017   LYMPHADENECTOMY Bilateral 08/03/2016   Procedure: PELVIC LYMPHADENECTOMY;  Surgeon: Raynelle Bring, MD;  Location: [facility] ORS;  Service: Urology;  Laterality: Bilateral;   MOLE REMOVAL  1990   PROSTATE BIOPSY  03/2016   ROBOT ASSISTED LAPAROSCOPIC RADICAL PROSTATECTOMY N/A 08/03/2016   Procedure: XI ROBOTIC ASSISTED LAPAROSCOPIC RADICAL PROSTATECTOMY LEVEL 2;  Surgeon: Raynelle Bring, MD;  Location: [facility]S;   Service: Urology;  Laterality: N/A;    Social History   Socioeconomic History   Marital status: Single    Spouse name: Not on file   Number of children: Not on file   Years of education: Not on file   Highest education level: Not on file  Occupational History   Not on file  Tobacco Use   Smoking status: Never   Smokeless tobacco: Never  Vaping Use   Vaping Use: Never used  Substance and Sexual Activity   Alcohol use: No   Drug use: No   Sexual activity: Not on file  Other Topics Concern   Not on file  Social History Narrative   Not on file   Social Determinants of Health   Financial Resource Strain: Not on file  Food Insecurity: Not on file  Transportation Needs: Not on file  Physical Activity: Not on file  Stress: Not on file  Social Connections: Not on file  Intimate Partner Violence: Not on file    Family History  Problem Relation Age of Onset   Hypertension Mother    Cancer Paternal Grandmother        brain cancer    Cancer Maternal Grandmother        ovarian cancer     Medications Prior to Admission  Medication Sig Dispense Refill Last Dose   carboxymethylcellulose (REFRESH PLUS) 0.5 % SOLN Place 1 drop into both eyes daily as needed (dry/irritated eyes).   More than a month   oxyCODONE-acetaminophen (ROXICET) 5-325 MG tablet Take 1-2 tablets by mouth every 4 (four) hours as needed for severe pain. (Patient not taking: Reported on  01/24/2021) 30 tablet 0 Not Taking    Current Facility-Administered Medications  Medication Dose Route Frequency Provider Last Rate Last Admin   bupivacaine liposome (EXPAREL) 1.3 % injection 266 mg  20 mL Infiltration Once Michael Boston, MD       ceFAZolin (ANCEF) 2 g in sodium chloride 0.9 % 100 mL IVPB  2 g Intravenous On Call to OR Michael Boston, MD       Chlorhexidine Gluconate Cloth 2 % PADS 6 each  6 each Topical Once Michael Boston, MD       lactated ringers infusion   Intravenous Continuous Josephine Igo, MD 10 mL/hr  at 02/06/21 0833 New Bag at 02/06/21 0833     No Known Allergies  BP (!) 177/105   Pulse 73   Temp 97.8 F (36.6 C) (Oral)   Resp 20   Ht 5\' 11"  (1.803 m)   Wt 90.3 kg   SpO2 99%   BMI 27.75 kg/m   Labs: No results found for this or any previous visit (from the past 48 hour(s)).  Imaging / Studies: No results found.   Adin Hector, M.D., F.A.C.S. Gastrointestinal and Minimally Invasive Surgery [facility] [address], [place], [place] [address] [phone] Main / Paging  02/06/2021 11:39 AM    Adin Hector

## 2021-02-06 NOTE — Anesthesia Procedure Notes (Addendum)
Procedure Name: Intubation Date/Time: 02/06/2021 12:30 PM Performed by: West Pugh, CRNA Pre-anesthesia Checklist: Patient identified, Emergency Drugs available, Suction available, Patient being monitored and Timeout performed Patient Re-evaluated:Patient Re-evaluated prior to induction Oxygen Delivery Method: Circle system utilized Preoxygenation: Pre-oxygenation with 100% oxygen Induction Type: IV induction Ventilation: Mask ventilation without difficulty and Oral airway inserted - appropriate to patient size Laryngoscope Size: Glidescope and 4 Grade View: Grade I Tube type: Oral Tube size: 7.5 mm Number of attempts: 2 Airway Equipment and Method: Rigid stylet and Video-laryngoscopy Placement Confirmation: ETT inserted through vocal cords under direct vision, positive ETCO2, CO2 detector and breath sounds checked- equal and bilateral Secured at: 22 cm Tube secured with: Tape Dental Injury: Teeth and Oropharynx as per pre-operative assessment  Difficulty Due To: Difficulty was anticipated and Difficult Airway- due to anterior larynx Comments: Glidescope utilized. Head elevated due to anterior location of larynx. Attempt x 1 by CRNA and not successful. Attempt x 1 by anesthesiologist and successful with lifting of head to acquire a better view of the larynx.

## 2021-02-06 NOTE — Op Note (Signed)
02/06/2021  PATIENT:  Isaiah Bush  60 y.o. male  Patient Care Team: Dion Body, MD as PCP - General (Family Medicine) Michael Boston, MD as Consulting Physician (General Surgery) Raynelle Bring, MD as Consulting Physician (Urology)  PRE-OPERATIVE DIAGNOSIS:  VENTRAL INCISIONAL INCARCERATED ABDOMINAL WALL HERNIA  POST-OPERATIVE DIAGNOSIS:  VENTRAL INCISIONAL INCARCERATED ABDOMINAL WALL HERNIA  PROCEDURE:   LAPAROSCOPIC REPAIR OF  INCISIONAL INCARCERATED ABDOMINAL WALL HERNIA WITH MESH  TAP BLOCK - BILATERAL  SURGEON:  Adin Hector, MD  ASSISTANT: .Lucas Mallow, MD, PGY 6 [place] I was personally present during the key and critical portions of this procedure and immediately available throughout the entire procedure, as documented in my operative note.    ANESTHESIA:     General  Regional TRANSVERSUS ABDOMINIS PLANE (TAP) nerve block for perioperative & postoperative pain control provided with liposomal bupivacaine (Experel) mixed with 0.25% bupivacaine as a Left TAP block x 32mL at the level of the transverse abdominis & preperitoneal spaces along the flank at the anterior axillary line, from subcostal ridge to iliac crest under laparoscopic guidance   EBL:  Total I/O In: 1100 [I.V.:1000; IV Piggyback:100] Out: 50 [Blood:50]  Per anesthesia record  Delay start of Pharmacological VTE agent (>24hrs) due to surgical blood loss or risk of bleeding:  no  DRAINS: none   SPECIMEN:  No Specimen  DISPOSITION OF SPECIMEN:  N/A  COUNTS:  YES  PLAN OF CARE: Discharge to home after PACU  PATIENT DISPOSITION:  PACU - hemodynamically stable.  INDICATION: Pleasant patient has developed a ventral wall abdominal hernia. Recommendation was made for surgical repair  The anatomy & physiology of the abdominal wall was discussed. The pathophysiology of hernias was discussed. Natural history risks without surgery including progeressive enlargement, pain, incarceration &  strangulation was discussed. Contributors to complications such as smoking, obesity, diabetes, prior surgery, etc were discussed.  I feel the risks of no intervention will lead to serious problems that outweigh the operative risks; therefore, I recommended surgery to reduce and repair the hernia. I explained laparoscopic techniques with possible need for an open approach. I noted the probable use of mesh to patch and/or buttress the hernia repair.  Risks such as bleeding, infection, abscess, need for further treatment, heart attack, death, and other risks were discussed. I noted a good likelihood this will help address the problem. Goals of post-operative recovery were discussed as well. Possibility that this will not correct all symptoms was explained. I stressed the importance of low-impact activity, aggressive pain control, avoiding constipation, & not pushing through pain to minimize risk of post-operative chronic pain or injury. Possibility of reherniation especially with smoking, obesity, diabetes, immunosuppression, and other health conditions was discussed. We will work to minimize complications.   An educational handout further explaining the pathology & treatment options was given as well. Questions were answered. The patient expresses understanding & wishes to proceed with surgery.   OR FINDINGS: 10 x 3 cm region of Swiss cheese hernias around just periumbilical.  Greater omentum incarcerated within it as well as falciform ligament.  No obstruction.  Type of repair: Laparoscopic underlay repair.  Primary repair of largest hernia   Placement of mesh: Centrally intraperitoneal with edges tucked into RECTRORECTUS & preperitoneal space  Name of mesh: Bard Ventralight dual sided (polypropylene / Seprafilm)  Size of mesh: 25x20cm  Orientation: Vertical  Mesh overlap:  5-7cm   DESCRIPTION:   Informed consent was confirmed. The patient underwent general anaesthesia without difficulty. The  patient was  positioned appropriately. VTE prevention in place. The patient's abdomen was clipped, prepped, & draped in a sterile fashion. Surgical timeout confirmed our plan.  The patient was positioned in reverse Trendelenburg. Abdominal entry was gained using optical entry technique in the left upper abdomen. Entry was clean. I induced carbon dioxide insufflation. Camera inspection revealed no injury. Extra ports were carefully placed under direct laparoscopic visualization.   I could see adhesions on the parietal peritoneum under the abdominal wall.  We did laparoscopic lysis of adhesions to expose the entire anterior abdominal wall.  I primarily used focused sharp dissection.  We freed off the falciform ligament and central peritoneum to expose the retrorectus fascia   I made sure hemostasis was good.  I mapped out the region using a needle passer.   He had multiple hernias along his upper midline incision, not just the obvious 1 at his umbilicus.  To ensure that I would have at least 5 cm radial coverage outside of the hernia defect, I chose a 25x20cm dual sided mesh.  I placed #1 Prolene stitches around its edge about every 5 cm = 12 total.  I rolled the mesh & placed into the peritoneal cavity through the largest hernia defect.  I unrolled the mesh and positioned it appropriately.  We secured the mesh to cover up the hernia defect using a laparoscopic suture passer to pass the tails of the Prolene through the abdominal wall & tagged them with clamps for good transfascial suturing.  I started out in four corners to make sure I had the mesh centered under the hernia defect appropriately, and then proceeded to work in quadrants.    We evacuated CO2 & desufflated the abdomen.  I tied the fascial stitches down. I closed the fascial defect that I placed the mesh through using #1 PDS interrupted transverse stitches primarily.  I reinsufflated the abdomen. The mesh provided at least circumferential coverage  around the entire region of hernia defects.  We secured the mesh centrally with an additional trans fascial stitch in & out the mesh using #1 PDS under laparoscopic visualization.   I tacked the edges & central part of the mesh to the peritoneum/posterior rectus fascia with SecureStrap absorbable tacks.   I did reinspection. Hemostasis was good. Mesh laid well. I completed a broad field block of local anesthesia at fascial stitch sites & fascial closure areas.    Capnoperitoneum was evacuated. Ports were removed. The skin was closed with Monocryl at the port sites and Steri-Strips on the fascial stitch puncture sites.  Patient is being extubated to go to the recovery room.  I discussed operative findings, updated the patient's status, discussed probable steps to recovery, and gave postoperative recommendations to the  patient's brother, Alene Mires .  Recommendations were made.  Questions were answered.  He expressed understanding & appreciation.  Adin Hector, M.D., F.A.C.S. Gastrointestinal and Minimally Invasive Surgery [facility] [address] [place], [place] [address] [phone] Main / Paging  02/06/2021 2:39 PM

## 2021-02-06 NOTE — Discharge Instructions (Signed)
HERNIA REPAIR: POST OP INSTRUCTIONS  ######################################################################  EAT Gradually transition to a high fiber diet with a fiber supplement over the next few weeks after discharge.  Start with a pureed / full liquid diet (see below)  WALK Walk an hour a day.  Control your pain to do that.    CONTROL PAIN Control pain so that you can walk, sleep, tolerate sneezing/coughing, and go up/down stairs.  HAVE A BOWEL MOVEMENT DAILY Keep your bowels regular to avoid problems.  OK to try a laxative to override constipation.  OK to use an antidairrheal to slow down diarrhea.  Call if not better after 2 tries  CALL IF YOU HAVE PROBLEMS/CONCERNS Call if you are still struggling despite following these instructions. Call if you have concerns not answered by these instructions  ######################################################################    DIET: Follow a light bland diet & liquids the first 24 hours after arrival home, such as soup, liquids, starches, etc.  Be sure to drink plenty of fluids.  Quickly advance to a usual solid diet within a few days.  Avoid fast food or heavy meals as your are more likely to get nauseated or have irregular bowels.  A low-fat, high-fiber diet for the rest of your life is ideal.   Take your usually prescribed home medications unless otherwise directed.  PAIN CONTROL: Pain is best controlled by a usual combination of three different methods TOGETHER: Ice/Heat Over the counter pain medication Prescription pain medication Most patients will experience some swelling and bruising around the hernia(s) such as the bellybutton, groins, or old incisions.  Ice packs or heating pads (30-60 minutes up to 6 times a day) will help. Use ice for the first few days to help decrease swelling and bruising, then switch to heat to help relax tight/sore spots and speed recovery.  Some people prefer to use ice alone, heat alone, alternating  between ice & heat.  Experiment to what works for you.  Swelling and bruising can take several weeks to resolve.   It is helpful to take an over-the-counter pain medication regularly for the first few weeks.  Choose one of the following that works best for you: Naproxen (Aleve, etc)  Two 220mg tabs twice a day Ibuprofen (Advil, etc) Three 200mg tabs four times a day (every meal & bedtime) Acetaminophen (Tylenol, etc) 325-650mg four times a day (every meal & bedtime) A  prescription for pain medication should be given to you upon discharge.  Take your pain medication as prescribed.  If you are having problems/concerns with the prescription medicine (does not control pain, nausea, vomiting, rash, itching, etc), please call us [phone] to see if we need to switch you to a different pain medicine that will work better for you and/or control your side effect better. If you need a refill on your pain medication, please contact your pharmacy.  They will contact our office to request authorization. Prescriptions will not be filled after 5 pm or on week-ends.  Avoid getting constipated.  Between the surgery and the pain medications, it is common to experience some constipation.  Increasing fluid intake and taking a fiber supplement (such as Metamucil, Citrucel, FiberCon, MiraLax, etc) 1-2 times a day regularly will usually help prevent this problem from occurring.  A mild laxative (prune juice, Milk of Magnesia, MiraLax, etc) should be taken according to package directions if there are no bowel movements after 48 hours.    Wash / shower every day.  You may shower over the dressings   as they are waterproof.    Remove your waterproof bandages, skin tapes, and other bandages 3 days after surgery. You may replace a dressing/Band-Aid to cover the incision for comfort if you wish. You may leave the incisions open to air.  You may replace a dressing/Band-Aid to cover an incision for comfort if you wish.  Continue  to shower over incision(s) after the dressing is off.  ACTIVITIES as tolerated:   You may resume regular (light) daily activities beginning the next day--such as daily self-care, walking, climbing stairs--gradually increasing activities as tolerated.  Control your pain so that you can walk an hour a day.  If you can walk 30 minutes without difficulty, it is safe to try more intense activity such as jogging, treadmill, bicycling, low-impact aerobics, swimming, etc. Save the most intensive and strenuous activity for last such as sit-ups, heavy lifting, contact sports, etc  Refrain from any heavy lifting or straining until you are off narcotics for pain control.   DO NOT PUSH THROUGH PAIN.  Let pain be your guide: If it hurts to do something, don't do it.  Pain is your body warning you to avoid that activity for another week until the pain goes down. You may drive when you are no longer taking prescription pain medication, you can comfortably wear a seatbelt, and you can safely maneuver your car and apply brakes. You may have sexual intercourse when it is comfortable.   FOLLOW UP in our office Please call [facility] at [phone] to set up an appointment to see your surgeon in the office for a follow-up appointment approximately 2-3 weeks after your surgery. Make sure that you call for this appointment the day you arrive home to insure a convenient appointment time.  9.  If you have disability of FMLA / Family leave forms, please bring the forms to the office for processing.  (do not give to your surgeon).  WHEN TO CALL US [phone]: Poor pain control Reactions / problems with new medications (rash/itching, nausea, etc)  Fever over 101.5 F (38.5 C) Inability to urinate Nausea and/or vomiting Worsening swelling or bruising Continued bleeding from incision. Increased pain, redness, or drainage from the incision   The clinic staff is available to answer your questions during regular business  hours (8:30am-5pm).  Please don't hesitate to call and ask to speak to one of our nurses for clinical concerns.   If you have a medical emergency, go to the nearest emergency room or call 911.  A surgeon from [facility] is always on call at the hospitals in [place]  [facility] [address], [address], [place], [place]  [address] ?  [address] [address], [place], [place]   [address] MAIN: [phone] ? TOLL FREE: [phone] ? FAX: [phone] [email or web address]  

## 2021-02-06 NOTE — Anesthesia Preprocedure Evaluation (Addendum)
Anesthesia Evaluation  Patient identified by MRN, date of birth, ID band Patient awake    Reviewed: Allergy & Precautions, NPO status , Patient's Chart, lab work & pertinent test results  History of Anesthesia Complications Negative for: history of anesthetic complications  Airway Mallampati: II  TM Distance: >3 FB Neck ROM: Full    Dental  (+) Teeth Intact, Dental Advisory Given   Pulmonary neg pulmonary ROS,    Pulmonary exam normal breath sounds clear to auscultation       Cardiovascular Exercise Tolerance: Good negative cardio ROS Normal cardiovascular exam Rhythm:Regular Rate:Normal     Neuro/Psych negative neurological ROS  negative psych ROS   GI/Hepatic Neg liver ROS, VENTRAL INCISIONAL INCARCERATED ABDOMINAL WALL HERNIA   Endo/Other  negative endocrine ROS  Renal/GU negative Renal ROS   Prostate cancer     Musculoskeletal negative musculoskeletal ROS (+)   Abdominal   Peds  Hematology negative hematology ROS (+)   Anesthesia Other Findings Day of surgery medications reviewed with the patient.  Reproductive/Obstetrics                            Anesthesia Physical Anesthesia Plan  ASA: 2  Anesthesia Plan: General   Post-op Pain Management:    Induction: Intravenous  PONV Risk Score and Plan: 3 and Midazolam, Dexamethasone and Ondansetron  Airway Management Planned: Oral ETT  Additional Equipment:   Intra-op Plan:   Post-operative Plan: Extubation in OR  Informed Consent: I have reviewed the patients History and Physical, chart, labs and discussed the procedure including the risks, benefits and alternatives for the proposed anesthesia with the patient or authorized representative who has indicated his/her understanding and acceptance.     Dental advisory given  Plan Discussed with: CRNA  Anesthesia Plan Comments:         Anesthesia Quick  Evaluation

## 2021-02-07 ENCOUNTER — Encounter (HOSPITAL_COMMUNITY): Payer: Self-pay | Admitting: Surgery

## 2022-07-27 ENCOUNTER — Other Ambulatory Visit: Payer: Self-pay

## 2022-07-27 ENCOUNTER — Emergency Department
Admission: EM | Admit: 2022-07-27 | Discharge: 2022-07-27 | Disposition: A | Discharge status: Home / Self Care | Payer: BC Managed Care – PPO | Attending: Emergency Medicine | Admitting: Emergency Medicine

## 2022-07-27 ENCOUNTER — Emergency Department: Payer: BC Managed Care – PPO

## 2022-07-27 DIAGNOSIS — Z8546 Personal history of malignant neoplasm of prostate: Secondary | ICD-10-CM | POA: Insufficient documentation

## 2022-07-27 DIAGNOSIS — R0789 Other chest pain: Secondary | ICD-10-CM | POA: Diagnosis not present

## 2022-07-27 LAB — BASIC METABOLIC PANEL
Anion gap: 8 (ref 5–15)
BUN: 16 mg/dL (ref 8–23)
CO2: 24 mmol/L (ref 22–32)
Calcium: 8.7 mg/dL — ABNORMAL LOW (ref 8.9–10.3)
Chloride: 102 mmol/L (ref 98–111)
Creatinine, Ser: 0.84 mg/dL (ref 0.61–1.24)
GFR, Estimated: >60 mL/min (ref >60)
Glucose, Bld: 98 mg/dL (ref 70–99)
Potassium: 3.6 mmol/L (ref 3.5–5.1)
Sodium: 134 mmol/L — ABNORMAL LOW (ref 135–145)

## 2022-07-27 LAB — TROPONIN I (HIGH SENSITIVITY)
Troponin I (High Sensitivity): 2 ng/L (ref <18)
Troponin I (High Sensitivity): 2 ng/L (ref <18)

## 2022-07-27 LAB — CBC
HCT: 40.4 % (ref 39.0–52.0)
Hemoglobin: 14 g/dL (ref 13.0–17.0)
MCH: 31.1 pg (ref 26.0–34.0)
MCHC: 34.7 g/dL (ref 30.0–36.0)
MCV: 89.8 fL (ref 80.0–100.0)
Platelets: 241 10*3/uL (ref 150–400)
RBC: 4.5 MIL/uL (ref 4.22–5.81)
RDW: 12.2 % (ref 11.5–15.5)
WBC: 5.8 10*3/uL (ref 4.0–10.5)
nRBC: 0 % (ref 0.0–0.2)

## 2022-07-27 NOTE — ED Provider Triage Note (Signed)
Emergency Medicine Provider Triage Evaluation Note  Isaiah Bush , a 62 y.o. male  was evaluated in triage.  Pt complains of chest pain and intermittent shortness of breath for the past 2 days. Similar symptoms in the past, but this has been unusually persistent. No cardiac history. .  Physical Exam  BP (!) 159/98   Pulse 67   Temp 97.9 F (36.6 C)   Resp 18   SpO2 97%  Gen:   Awake, no distress   Resp:  Normal effort  MSK:   Moves extremities without difficulty  Other:    Medical Decision Making  Medically screening exam initiated at 4:15 PM.  Appropriate orders placed.  Render Isaiah Bush was informed that the remainder of the evaluation will be completed by another provider, this initial triage assessment does not replace that evaluation, and the importance of remaining in the ED until their evaluation is complete.  Cardiac protocol started.    Victorino Dike, FNP 07/27/22 1617

## 2022-07-27 NOTE — ED Provider Notes (Signed)
[facility] Provider Note    Event Date/Time   First MD Initiated Contact with Patient 07/27/22 2036     (approximate)   History   Chest Pain   HPI  Isaiah Bush is a 62 y.o. male with a history of hyperlipidemia and prostate cancer status post resection who presents with chest discomfort, acute onset yesterday, mainly in an area just to the right of his sternum, nonradiating, and not associated with any significant symptoms.  The patient states that he has had this discomfort occasionally in the past although it was steady since yesterday.  It is not exertional.  The patient reports very mild intermittent shortness of breath that has been occurring for weeks or longer.  He denies any lightheadedness or dizziness.  He has no leg pain or swelling.  He states he sometimes has had the chest discomfort after drinking [facility] and has tried to cut down.  He previously had symptoms of GERD that were associated with eating late at night but he has stopped doing this.  I reviewed the past medical records.  The patient's most recent outpatient encounter was a primary care visit with Dr. Netty Starring on 02/05/2022 for follow-up of chronic issues.  He has no prior cardiac history.  Physical Exam   Triage Vital Signs: ED Triage Vitals [07/27/22 1615]  Enc Vitals Group     BP (!) 159/98     Pulse Rate 67     Resp 18     Temp 97.9 F (36.6 C)     Temp src      SpO2 97 %     Weight 185 lb (83.9 kg)     Height '5\' 11"'$  (1.803 m)     Head Circumference      Peak Flow      Pain Score 3     Pain Loc      Pain Edu?      Excl. in [place]?     Most recent vital signs: Vitals:   07/27/22 1615  BP: (!) 159/98  Pulse: 67  Resp: 18  Temp: 97.9 F (36.6 C)  SpO2: 97%    General: Alert and oriented, well-appearing. CV:  Good peripheral perfusion.  Normal heart sounds. Resp:  Normal effort.  Lungs CTAB. Abd:  No distention.  Other:  No peripheral edema; no calf or  popliteal swelling or tenderness.   ED Results / Procedures / Treatments   Labs (all labs ordered are listed, but only abnormal results are displayed) Labs Reviewed  BASIC METABOLIC PANEL - Abnormal; Notable for the following components:      Result Value   Sodium 134 (*)    Calcium 8.7 (*)    All other components within normal limits  CBC  TROPONIN I (HIGH SENSITIVITY)  TROPONIN I (HIGH SENSITIVITY)     EKG  ED ECG REPORT I, Arta Silence, the attending physician, personally viewed and interpreted this ECG.  Date: 07/27/2022 EKG Time: 1614 Rate: 66 Rhythm: normal sinus rhythm QRS Axis: normal Intervals: Incomplete RBBB ST/T Wave abnormalities: normal Narrative Interpretation: no evidence of acute ischemia   RADIOLOGY  Chest x-ray: I independently viewed and interpreted the images; there is no focal consolidation or edema  PROCEDURES:  Critical Care performed: No  Procedures   MEDICATIONS ORDERED IN ED: Medications - No data to display   IMPRESSION / MDM / [place] / ED COURSE  I reviewed the triage vital signs and the nursing notes.  62 year old male with history of hyperlipidemia presents with atypical chest discomfort since yesterday with no significant associated symptoms.  Physical exam is unremarkable.  EKG is nonischemic.  Chest x-ray shows no acute findings.  Initial and repeat troponins are both negative.  Differential diagnosis includes, but is not limited to, GERD, other GI cause, musculoskeletal chest wall pain, radiculopathy or other nerve pain, or other benign etiology.  Given the reassuring EKG and 2 negative troponins there is no evidence of ACS.  Although the patient cannot be ruled out for PE by [facility] due to his age, he has no other significant risk factors and there is no clinical evidence for PE.  I did discuss with him via shared decision making the possibility of obtaining a D-dimer to more fully rule out PE, however he  declines.  There is also no clinical evidence for aortic dissection or other vascular cause.  At this time given the negative workup and the minimal symptoms the patient is stable for discharge home with outpatient follow-up which he already has arranged for this month.  I gave him strict return precautions and he expresses understanding.  Patient's presentation is most consistent with acute presentation with potential threat to life or bodily function.  The patient is on the cardiac monitor to evaluate for evidence of arrhythmia and/or significant heart rate changes.   FINAL CLINICAL IMPRESSION(S) / ED DIAGNOSES   Final diagnoses:  Chest discomfort     Rx / DC Orders   ED Discharge Orders     None        Note:  This document was prepared using Dragon voice recognition software and may include unintentional dictation errors.    Arta Silence, MD 07/27/22 2215

## 2022-07-27 NOTE — Discharge Instructions (Signed)
Follow-up with your primary care doctor as scheduled.  In the meantime return to the ER for new, worsening, or persistent severe chest discomfort or pain, difficulty breathing, exertional symptoms, weakness or lightheadedness, leg swelling, or any other new or worsening symptoms that concern you.

## 2022-07-27 NOTE — ED Triage Notes (Signed)
Pt to ED POV for chest pressure for past couple days. Reports some shob with exertion. Denies n/v.  NAD noted

## 2023-02-16 ENCOUNTER — Other Ambulatory Visit: Payer: Self-pay | Admitting: Family Medicine

## 2023-02-16 DIAGNOSIS — E871 Hypo-osmolality and hyponatremia: Secondary | ICD-10-CM

## 2023-03-12 ENCOUNTER — Ambulatory Visit
Admission: RE | Admit: 2023-03-12 | Discharge: 2023-03-12 | Disposition: A | Discharge status: Home / Self Care | Payer: Self-pay | Source: Ambulatory Visit | Attending: Family Medicine | Admitting: Family Medicine

## 2023-03-12 DIAGNOSIS — E871 Hypo-osmolality and hyponatremia: Secondary | ICD-10-CM | POA: Insufficient documentation
